# Patient Record
Sex: Male | Born: 1980
Health system: Southern US, Community
[De-identification: ages and names within clinical notes are randomized; demographics above are authoritative.]

---
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
## 2005-01-22 ENCOUNTER — Emergency Department (HOSPITAL_COMMUNITY): Admission: EM | Admit: 2005-01-22 | Discharge: 2005-01-22 | Payer: Self-pay | Admitting: Emergency Medicine

## 2008-06-26 ENCOUNTER — Emergency Department (HOSPITAL_COMMUNITY): Admission: EM | Admit: 2008-06-26 | Discharge: 2008-06-26 | Payer: Self-pay | Admitting: Emergency Medicine

## 2008-08-29 ENCOUNTER — Emergency Department (HOSPITAL_COMMUNITY): Admission: EM | Admit: 2008-08-29 | Discharge: 2008-08-29 | Payer: Self-pay | Admitting: Emergency Medicine

## 2010-04-22 LAB — BASIC METABOLIC PANEL
BUN: 5 mg/dL — ABNORMAL LOW (ref 6–23)
CO2: 28 mEq/L (ref 19–32)
Calcium: 9.5 mg/dL (ref 8.4–10.5)
Chloride: 102 mEq/L (ref 96–112)
Creatinine, Ser: 0.95 mg/dL (ref 0.4–1.5)
GFR calc Af Amer: >60 mL/min (ref >60)
GFR calc non Af Amer: >60 mL/min (ref >60)
Glucose, Bld: 88 mg/dL (ref 70–99)
Potassium: 3.7 mEq/L (ref 3.5–5.1)
Sodium: 139 mEq/L (ref 135–145)

## 2010-04-22 LAB — CBC
HCT: 40 % (ref 39.0–52.0)
Hemoglobin: 13.3 g/dL (ref 13.0–17.0)
MCHC: 33.3 g/dL (ref 30.0–36.0)
MCV: 102.6 fL — ABNORMAL HIGH (ref 78.0–100.0)
Platelets: 174 10*3/uL (ref 150–400)
RBC: 3.9 MIL/uL — ABNORMAL LOW (ref 4.22–5.81)
RDW: 12.8 % (ref 11.5–15.5)
WBC: 8.4 10*3/uL (ref 4.0–10.5)

## 2010-04-22 LAB — DIFFERENTIAL
Basophils Absolute: 0 10*3/uL (ref 0.0–0.1)
Basophils Relative: 0 % (ref 0–1)
Eosinophils Absolute: 0 10*3/uL (ref 0.0–0.7)
Eosinophils Relative: 0 % (ref 0–5)
Lymphocytes Relative: 17 % (ref 12–46)
Lymphs Abs: 1.5 10*3/uL (ref 0.7–4.0)
Monocytes Absolute: 0.6 10*3/uL (ref 0.1–1.0)
Monocytes Relative: 8 % (ref 3–12)
Neutro Abs: 6.3 10*3/uL (ref 1.7–7.7)
Neutrophils Relative %: 75 % (ref 43–77)
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
## 2010-09-16 IMAGING — CT CT MAXILLOFACIAL W/ CM
3 series · 16 of 47 positions shown, 19 images · IV contrast (agent unspecified)
Comparison: None

CLINICAL DATA: Left facial swelling, pain and fever.  Question
abscess.

CT MAXILLOFACIAL WITH CONTRAST
TECHNIQUE: Multidetector CT imaging of the maxillofacial
structures was performed with intravenous contrast. Multiplanar CT
image reconstructions were also generated.
Contrast: 80 ml 2mnipaque-5XX intravenously.

[Series 4: orbit/facial 2.0 h30s st · axial · 0.43mm/px · z∈[+942,+1116]mm · 10 of 101 slices shown, 13 images]
[im 7/101  brain]
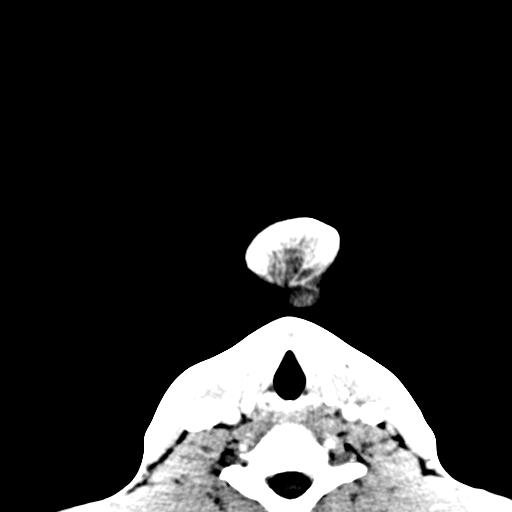
[im 7/101  bone]
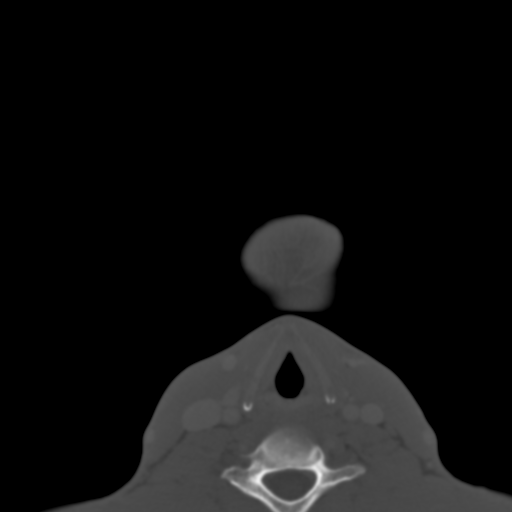
[im 18/101  bone]
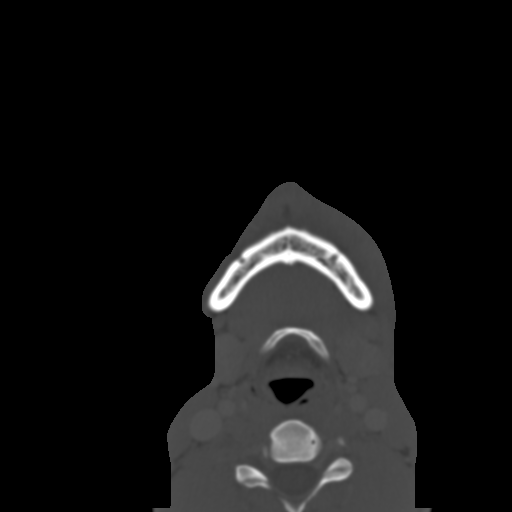
[im 28/101  bone]
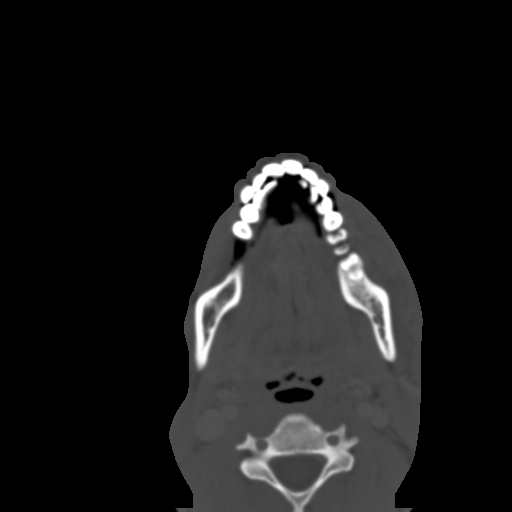
[im 35/101  bone]
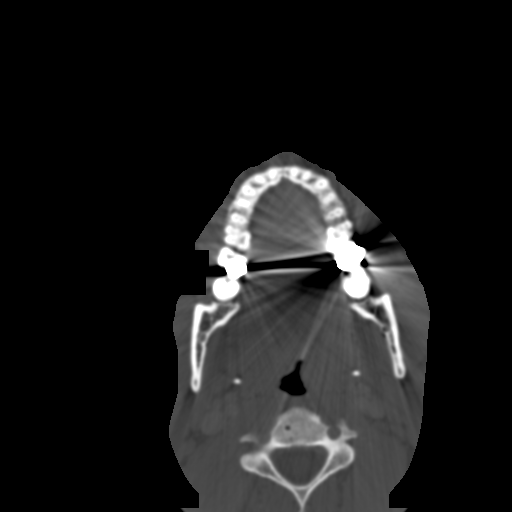
[im 45/101  brain]
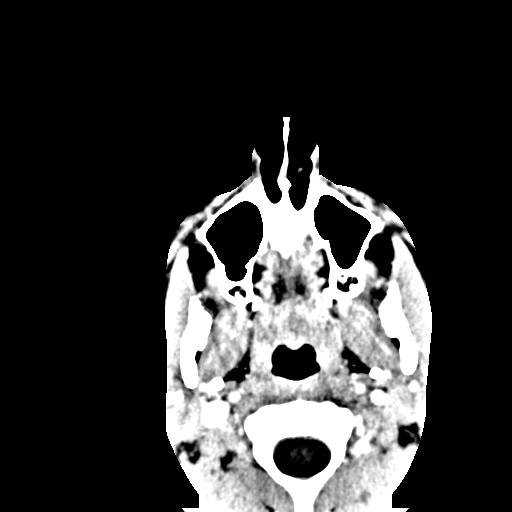
[im 45/101  bone]
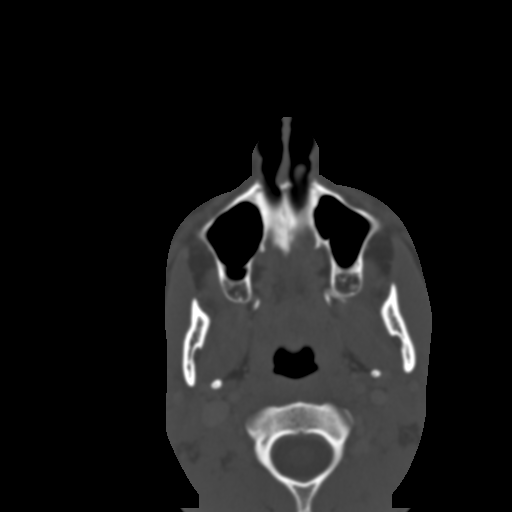
[im 56/101  bone]
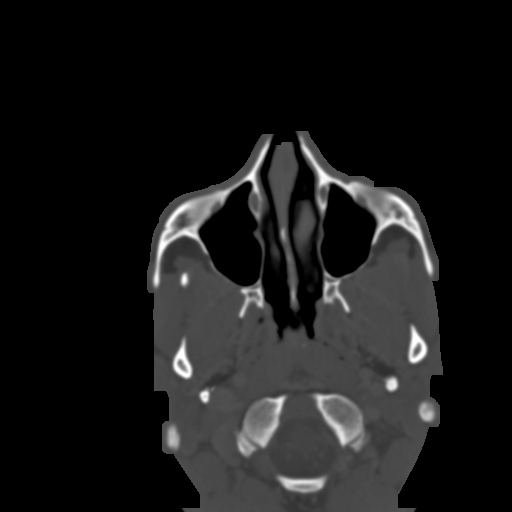
[im 66/101  bone]
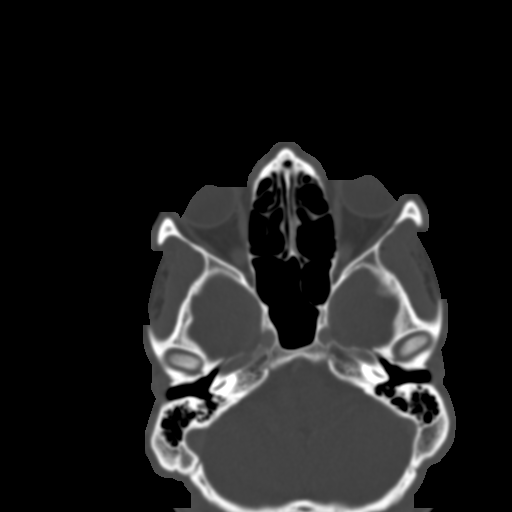
[im 76/101  bone]
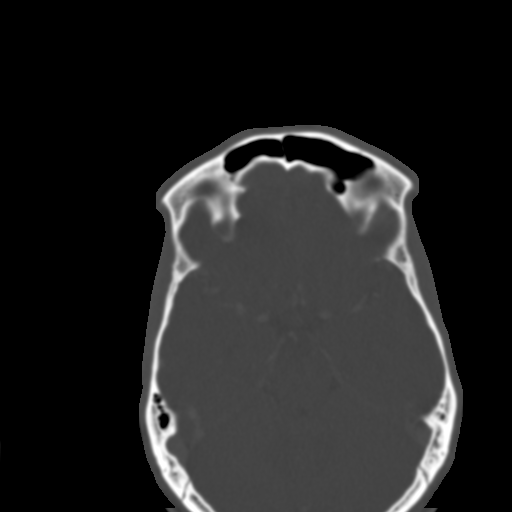
[im 83/101  brain]
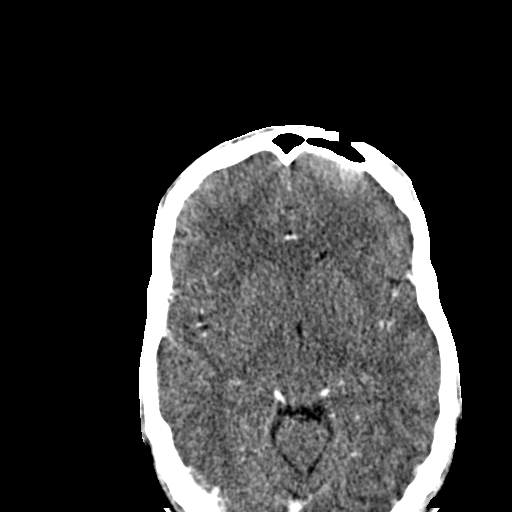
[im 83/101  bone]
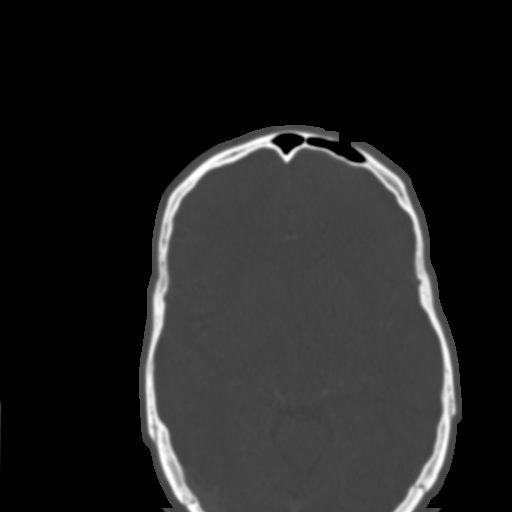
[im 94/101  bone]
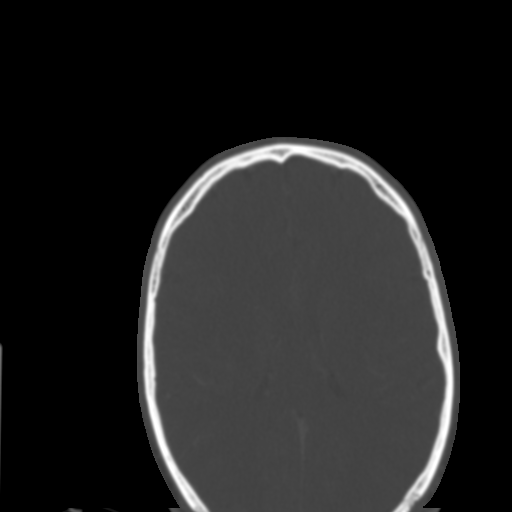

[Series 5: orbit/facial 2.0 spo · coronal · 0.39mm/px · 3 of 80 slices shown (1 of 2)]
[im 27/80  bone]
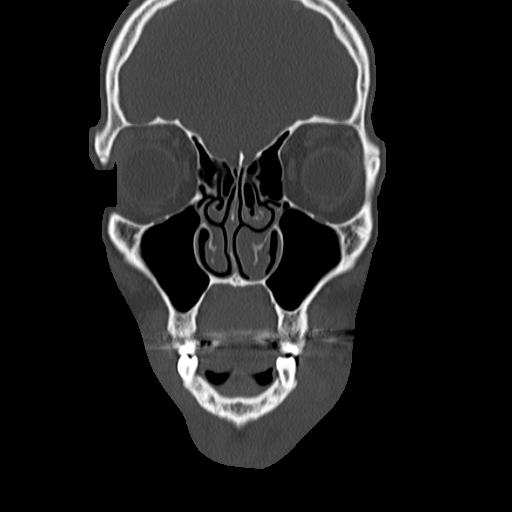
[im 36/80  bone]
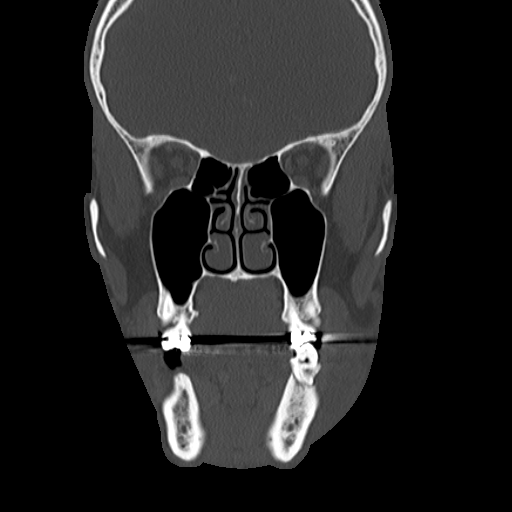
[im 44/80  bone]
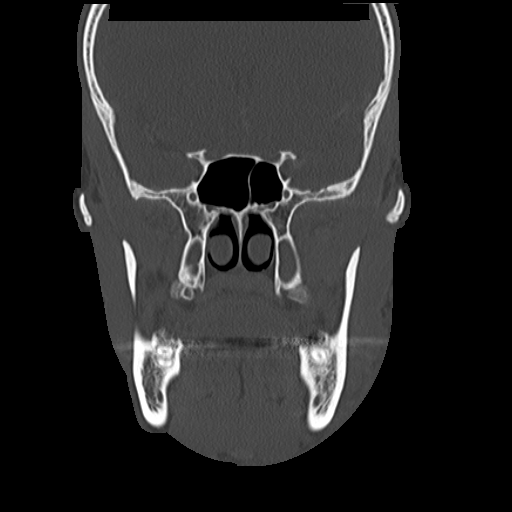

[Series 6: orbit/facial 2.0 spo · sagittal · 0.39mm/px · 3 of 71 slices shown (2 of 2)]
[im 24/71  bone]
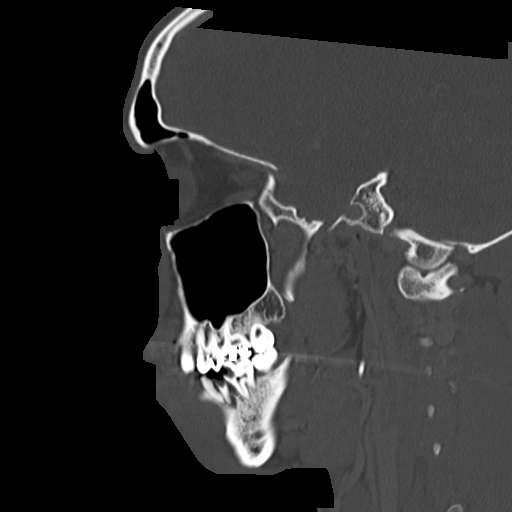
[im 36/71  bone]
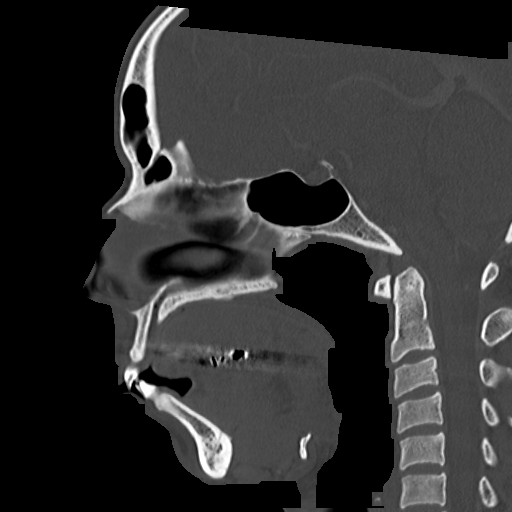
[im 47/71  bone]
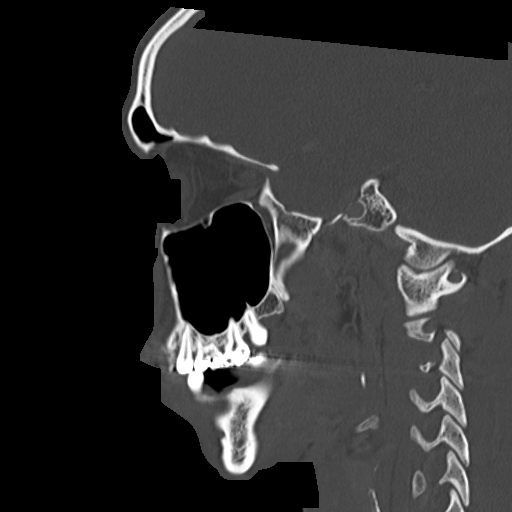

[16 of 47 positions shown; findings below may reference images not displayed]

FINDINGS: There is left perimandibular soft tissue swelling.  Ill-
defined adjacent low density measures up to 1.5 cm on the coronal
images and is suspicious for an early soft tissue abscess.  There
is diffuse periodontal disease with multiple dental caries.
Prominent lucencies are noted adjacent to teeth 18 and 19.  There
is also periapical lucency adjacent to tooth #12.

The temporal mandibular joints are intact.  The mastoids, middle
ears and paranasal sinuses are clear.  No orbital abnormalities are
identified.  The visualized intracranial contents appear
unremarkable.
IMPRESSION: 1.  Left mandibular periodontal disease as described with adjacent
soft tissue swelling and probable early soft tissue abscess
formation. Teeth 18 and 19 appear primarily affected.
2.  Additional periodontal disease with prominent involvement of
tooth number 12.
3.  No evidence of sinusitis or orbital cellulitis.

## 2012-10-05 ENCOUNTER — Emergency Department (HOSPITAL_BASED_OUTPATIENT_CLINIC_OR_DEPARTMENT_OTHER)
Admission: EM | Admit: 2012-10-05 | Discharge: 2012-10-05 | Disposition: A | Discharge status: Home / Self Care | Payer: Self-pay | Attending: Emergency Medicine | Admitting: Emergency Medicine

## 2012-10-05 ENCOUNTER — Encounter (HOSPITAL_BASED_OUTPATIENT_CLINIC_OR_DEPARTMENT_OTHER): Payer: Self-pay | Admitting: Student

## 2012-10-05 DIAGNOSIS — F17200 Nicotine dependence, unspecified, uncomplicated: Secondary | ICD-10-CM | POA: Insufficient documentation

## 2012-10-05 DIAGNOSIS — N342 Other urethritis: Secondary | ICD-10-CM | POA: Insufficient documentation

## 2012-10-05 MED ORDER — AZITHROMYCIN 1 G PO PACK
1.0000 g | PACK | Freq: Once | ORAL | Status: AC
  Administered 2012-10-05: 1 g via ORAL
  Filled 2012-10-05: qty 1

## 2012-10-05 MED ORDER — CEFTRIAXONE SODIUM 250 MG IJ SOLR
250.0000 mg | Freq: Once | INTRAMUSCULAR | Status: AC
  Administered 2012-10-05: 250 mg via INTRAMUSCULAR
  Filled 2012-10-05: qty 250

## 2012-10-05 NOTE — ED Provider Notes (Signed)
CSN: [id number]     Arrival date & time 10/05/12  0045 History   First MD Initiated Contact with Patient 10/05/12 0059     Chief Complaint  Patient presents with  . Penile Discharge   (Consider location/radiation/quality/duration/timing/severity/associated sxs/prior Treatment) HPI This is a 32 year old male with about a three-day history of burning at the end of urination and white discharge from his urethral meatus. He's noticed a discharge in his underwear. Symptoms are like that of prior STDs. He denies systemic symptoms such as fever, chills, nausea, vomiting or abdominal pain.  History reviewed. No pertinent past medical history. History reviewed. No pertinent past surgical history. History reviewed. No pertinent family history. History  Substance Use Topics  . Smoking status: Current Every Day Smoker  . Smokeless tobacco: Not on file  . Alcohol Use: Yes    Review of Systems  All other systems reviewed and are negative.    Allergies  Review of patient's allergies indicates no known allergies.  Home Medications  No current outpatient prescriptions on file. BP 134/73  Pulse 79  Temp(Src) 98.2 F (36.8 C) (Oral)  Resp 20  Wt 170 lb (77.111 kg)  SpO2 100%  Physical Exam General: Well-developed, well-nourished male in no acute distress; appearance consistent with age of record HENT: normocephalic; atraumatic Eyes: pupils equal, round and reactive to light; extraocular muscles intact Neck: supple Heart: regular rate and rhythm; no murmurs, rubs or gallops Lungs: clear to auscultation bilaterally Abdomen: soft; nondistended; nontender; no masses or hepatosplenomegaly; bowel sounds present GU: Tanner 4 male, circumcised; no urethral discharge seen Extremities: No deformity; full range of motion Neurologic: Awake, alert and oriented; motor function intact in all extremities and symmetric; no facial droop Skin: Warm and dry Psychiatric: Normal mood and affect   ED  Course  Procedures (including critical care time)  MDM  We will treat for GC and chlamydia.    Hanley Seamen, MD 10/05/12 [phone]

## 2012-10-05 NOTE — ED Notes (Signed)
MD at bedside. 

## 2012-10-05 NOTE — ED Notes (Signed)
Painful urination, white discharge from penis.

## 2012-10-07 NOTE — ED Notes (Signed)
+   Gonorrhea + Chlamydia  Patient treated with Rocephin And Zithromax-[facility] faxed 

## 2012-10-08 ENCOUNTER — Telehealth (HOSPITAL_COMMUNITY): Payer: Self-pay | Admitting: Emergency Medicine

## 2016-03-20 ENCOUNTER — Encounter (HOSPITAL_BASED_OUTPATIENT_CLINIC_OR_DEPARTMENT_OTHER): Payer: Self-pay | Admitting: Emergency Medicine

## 2016-03-20 ENCOUNTER — Emergency Department (HOSPITAL_BASED_OUTPATIENT_CLINIC_OR_DEPARTMENT_OTHER)
Admission: EM | Admit: 2016-03-20 | Discharge: 2016-03-20 | Disposition: A | Discharge status: Home / Self Care | Payer: Self-pay | Attending: Emergency Medicine | Admitting: Emergency Medicine

## 2016-03-20 DIAGNOSIS — F1290 Cannabis use, unspecified, uncomplicated: Secondary | ICD-10-CM | POA: Insufficient documentation

## 2016-03-20 DIAGNOSIS — N342 Other urethritis: Secondary | ICD-10-CM | POA: Insufficient documentation

## 2016-03-20 DIAGNOSIS — F17200 Nicotine dependence, unspecified, uncomplicated: Secondary | ICD-10-CM | POA: Insufficient documentation

## 2016-03-20 LAB — URINALYSIS, MICROSCOPIC (REFLEX)

## 2016-03-20 LAB — URINALYSIS, ROUTINE W REFLEX MICROSCOPIC
Bilirubin Urine: NEGATIVE
Glucose, UA: NEGATIVE mg/dL
Ketones, ur: NEGATIVE mg/dL
NITRITE: NEGATIVE
Protein, ur: NEGATIVE mg/dL
Specific Gravity, Urine: 1.017 (ref 1.005–1.030)
pH: 7.5 (ref 5.0–8.0)

## 2016-03-20 MED ORDER — CEFTRIAXONE SODIUM 250 MG IJ SOLR
250.0000 mg | Freq: Once | INTRAMUSCULAR | Status: AC
Start: 2016-03-20 — End: 2016-03-20
  Administered 2016-03-20: 250 mg via INTRAMUSCULAR
  Filled 2016-03-20: qty 250

## 2016-03-20 MED ORDER — AZITHROMYCIN 1 G PO PACK
1.0000 g | PACK | Freq: Once | ORAL | Status: AC
  Administered 2016-03-20: 1 g via ORAL
  Filled 2016-03-20: qty 1
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
## 2016-03-20 NOTE — ED Triage Notes (Signed)
Patient states that he has some blood in his urine today after work. The patient denies any pain while sitting in triage. No distress noted.

## 2016-03-20 NOTE — ED Notes (Signed)
Pt verbalizes understanding of d/c instructions and denies any further needs at this time. 

## 2016-03-24 LAB — GC/CHLAMYDIA PROBE AMP (~~LOC~~) NOT AT ARMC
Chlamydia: POSITIVE — AB
Neisseria Gonorrhea: NEGATIVE

## 2016-04-24 ENCOUNTER — Encounter (HOSPITAL_BASED_OUTPATIENT_CLINIC_OR_DEPARTMENT_OTHER): Payer: Self-pay | Admitting: *Deleted

## 2016-04-24 DIAGNOSIS — S81012A Laceration without foreign body, left knee, initial encounter: Secondary | ICD-10-CM | POA: Insufficient documentation

## 2016-04-24 DIAGNOSIS — F17200 Nicotine dependence, unspecified, uncomplicated: Secondary | ICD-10-CM | POA: Insufficient documentation

## 2016-04-24 DIAGNOSIS — Y999 Unspecified external cause status: Secondary | ICD-10-CM | POA: Insufficient documentation

## 2016-04-24 DIAGNOSIS — Y929 Unspecified place or not applicable: Secondary | ICD-10-CM | POA: Insufficient documentation

## 2016-04-24 DIAGNOSIS — W268XXA Contact with other sharp object(s), not elsewhere classified, initial encounter: Secondary | ICD-10-CM | POA: Insufficient documentation

## 2016-04-24 DIAGNOSIS — Z23 Encounter for immunization: Secondary | ICD-10-CM | POA: Insufficient documentation

## 2016-04-24 DIAGNOSIS — Y9389 Activity, other specified: Secondary | ICD-10-CM | POA: Insufficient documentation

## 2016-04-24 NOTE — ED Triage Notes (Signed)
Pt c/o left knee lac by metal x 8 hrs ago

## 2016-04-25 ENCOUNTER — Emergency Department (HOSPITAL_BASED_OUTPATIENT_CLINIC_OR_DEPARTMENT_OTHER)
Admission: EM | Admit: 2016-04-25 | Discharge: 2016-04-25 | Disposition: A | Discharge status: Home / Self Care | Payer: Self-pay | Attending: Emergency Medicine | Admitting: Emergency Medicine

## 2016-04-25 DIAGNOSIS — S81012A Laceration without foreign body, left knee, initial encounter: Secondary | ICD-10-CM

## 2016-04-25 MED ORDER — CEPHALEXIN 500 MG PO CAPS: 500.0000 mg | ORAL_CAPSULE | Freq: Four times a day (QID) | ORAL | 0 refills | Status: DC

## 2016-04-25 MED ORDER — LIDOCAINE-EPINEPHRINE 2 %-1:100000 IJ SOLN
20.0000 mL | Freq: Once | INTRAMUSCULAR | Status: AC
  Administered 2016-04-25: 5 mL via INTRADERMAL
  Filled 2016-04-25: qty 1

## 2016-04-25 MED ORDER — TETANUS-DIPHTH-ACELL PERTUSSIS 5-2.5-18.5 LF-MCG/0.5 IM SUSP
0.5000 mL | Freq: Once | INTRAMUSCULAR | Status: AC
  Administered 2016-04-25: 0.5 mL via INTRAMUSCULAR
  Filled 2016-04-25: qty 0.5

## 2016-04-25 MED ORDER — CEPHALEXIN 250 MG PO CAPS
1000.0000 mg | ORAL_CAPSULE | Freq: Once | ORAL | Status: AC
  Administered 2016-04-25: 1000 mg via ORAL
  Filled 2016-04-25: qty 4

## 2016-04-25 NOTE — ED Provider Notes (Signed)
[facility] Provider Note: Seth Dell, MD, FACEP  CSN: [id number] MRN: [id number] ARRIVAL: 04/24/16 at 2307 ROOM: MH06/MH06   CHIEF COMPLAINT  Laceration   HISTORY OF PRESENT ILLNESS  Seth Martin is a 36 y.o. male who hit his left knee while assembling a bed frame at work yesterday afternoon about 3 PM. He has a laceration overlying the inferior aspect of his patella. Bleeding has been controlled with pressure. He states it is no longer painful. He is not sure of his last tetanus shot. There is no functional deficit or numbness associated with the wound.   History reviewed. No pertinent past medical history.  History reviewed. No pertinent surgical history.  History reviewed. No pertinent family history.  Social History  Substance Use Topics  . Smoking status: Current Every Day Smoker    Packs/day: 1.00  . Smokeless tobacco: Never Used  . Alcohol use Yes    Prior to Admission medications   Not on File    Allergies Shellfish allergy   REVIEW OF SYSTEMS  Negative except as noted here or in the History of Present Illness.   PHYSICAL EXAMINATION  Initial Vital Signs Blood pressure 113/64, pulse (!) 58, temperature 98.6 F (37 C), temperature source Oral, resp. rate 16, height  (1.854 m), weight 160 lb (72.6 kg), SpO2 99 %.  Examination General: Well-developed, well-nourished male in no acute distress; appearance consistent with age of record HENT: normocephalic; atraumatic Eyes: Normal appearance Neck: supple Heart: regular rate and rhythm Lungs: clear to auscultation bilaterally Abdomen: soft; nondistended Extremities: No deformity; full range of motion; pulses normal Neurologic: Awake, alert and oriented; motor function intact in all extremities and symmetric; no facial droop Skin: Warm and dry Psychiatric: Normal mood and affect   RESULTS  Summary of this visit's results, reviewed by myself:   EKG Interpretation  Date/Time:      Ventricular Rate:    PR Interval:    QRS Duration:   QT Interval:    QTC Calculation:   R Axis:     Text Interpretation:        Laboratory Studies: No results found for this or any previous visit (from the past 24 hour(s)). Imaging Studies: No results found.  ED COURSE  Nursing notes and initial vitals signs, including pulse oximetry, reviewed.  Vitals:   04/24/16 2310 04/24/16 2314 04/25/16 0054  BP:  (!) 109/98 113/64  Pulse:  82 (!) 58  Resp:  16   Temp:  98.1 F (36.7 C) 98.6 F (37 C)  TempSrc:  Oral Oral  SpO2:  100% 99%  Weight: 160 lb (72.6 kg)    Height:  (1.854 m)      PROCEDURES   LACERATION REPAIR Performed by: Hanley Seamen Authorized by: Hanley Seamen Consent: Verbal consent obtained. Risks and benefits: risks, benefits and alternatives were discussed Consent given by: patient Patient identity confirmed: provided demographic data Prepped and Draped in normal sterile fashion Wound explored  Laceration Location: Left knee  Laceration Length: 4 cm  No Foreign Bodies seen or palpated  Anesthesia: local infiltration  Local anesthetic: lidocaine 2 % with epinephrine  Anesthetic total: 2.5 ml  Irrigation method: syringe Amount of cleaning: standard  Skin closure: Staples   Number of staples: 4   Patient tolerance: Patient tolerated the procedure well with no immediate complications.   ED DIAGNOSES     ICD-9-CM ICD-10-CM   1. Laceration of skin of left knee, initial encounter 891.0 S81.012A  Seth Libra, MD 04/25/16 [phone]

## 2016-05-04 ENCOUNTER — Encounter (HOSPITAL_BASED_OUTPATIENT_CLINIC_OR_DEPARTMENT_OTHER): Payer: Self-pay | Admitting: *Deleted

## 2016-05-04 ENCOUNTER — Emergency Department (HOSPITAL_BASED_OUTPATIENT_CLINIC_OR_DEPARTMENT_OTHER)
Admission: EM | Admit: 2016-05-04 | Discharge: 2016-05-04 | Disposition: A | Discharge status: Home / Self Care | Payer: Self-pay | Attending: Emergency Medicine | Admitting: Emergency Medicine

## 2016-05-04 DIAGNOSIS — F17200 Nicotine dependence, unspecified, uncomplicated: Secondary | ICD-10-CM | POA: Insufficient documentation

## 2016-05-04 DIAGNOSIS — Z4802 Encounter for removal of sutures: Secondary | ICD-10-CM | POA: Insufficient documentation

## 2016-05-04 NOTE — ED Triage Notes (Signed)
EDP into room, prior to RN assessment, see MD notes, orders received to d/c initiated. Triage and care assumed at time of d/c.   Returns on day 10 for L knee staple removal #4, denies complications, completed keflex, denies pain, wound approximated and healing well w/o redness, swelling, drainage  Alert, NAD, calm, interactive, resps e/u, speaking in clear complete sentences, no dyspnea noted, skin W&D, VSS, (denies: pain, sob, nausea, dizziness or visual changes), EDP into room. Family at [facility].

## 2016-05-04 NOTE — ED Notes (Signed)
Pt seen by EDP after registration, prior to triage.

## 2016-05-04 NOTE — ED Provider Notes (Signed)
   [facility] Provider Note: [name], MD, FACEP  CSN: [id number] MRN: [id number] ARRIVAL: 05/04/16 at 0630 ROOM: MHTR1/MHTR1   CHIEF COMPLAINT  Staple Removal   HISTORY OF PRESENT ILLNESS  Seth Martin is a 36 y.o. male who I laceration to his left knee closed with staples by myself on the 13th of this month. He returns for staple removal. He is having no pain, swelling, erythema or drainage at the wound site. He presents a day early because he has to leave town tomorrow.   History reviewed. No pertinent past medical history.  History reviewed. No pertinent surgical history.  History reviewed. No pertinent family history.  Social History  Substance Use Topics  . Smoking status: Current Every Day Smoker    Packs/day: 1.00  . Smokeless tobacco: Never Used  . Alcohol use Yes    Prior to Admission medications   Not on File    Allergies Shellfish allergy   REVIEW OF SYSTEMS  Negative except as noted here or in the History of Present Illness.   PHYSICAL EXAMINATION  Initial Vital Signs Blood pressure 120/77, pulse 66, temperature 98.8 F (37.1 C), temperature source Oral, resp. rate 16, height  (1.854 m), weight 160 lb (72.6 kg), SpO2 100 %.   Examination General: Well-developed, well-nourished male in no acute distress; appearance consistent with age of record HENT: normocephalic; atraumatic Eyes: Normal appearance Neck: supple Heart: regular rate and rhythm Lungs: clear to auscultation bilaterally Abdomen: soft; nondistended Extremities: No deformity; full range of motion Neurologic: Awake, alert and oriented; motor function intact in all extremities and symmetric; no facial droop Skin: Warm and dry; well healing stapled wound of left knee without signs of infection Psychiatric: Normal mood and affect   RESULTS  Summary of this visit's results, reviewed by myself:   EKG Interpretation  Date/Time:    Ventricular Rate:    PR  Interval:    QRS Duration:   QT Interval:    QTC Calculation:   R Axis:     Text Interpretation:        Laboratory Studies: No results found for this or any previous visit (from the past 24 hour(s)). Imaging Studies: No results found.  ED COURSE  Nursing notes and initial vitals signs, including pulse oximetry, reviewed.  Vitals:   05/04/16 0648 05/04/16 0651  BP: 120/77   Pulse: 66   Resp: 16   Temp: 98.8 F (37.1 C)   TempSrc: Oral   SpO2: 100%   Weight:  160 lb (72.6 kg)  Height:   (1.854 m)    PROCEDURES   STAPLE REMOVAL Four surgical staples were removed using the standard surgical staple remover from the patient's left knee. He tolerated this well and there were no immediate complications.  ED DIAGNOSES     ICD-9-CM ICD-10-CM   1. Removal of staples V58.32 Z48.02        [name], MD 05/04/16 [phone]

## 2016-05-16 DIAGNOSIS — F17200 Nicotine dependence, unspecified, uncomplicated: Secondary | ICD-10-CM | POA: Insufficient documentation

## 2016-05-16 DIAGNOSIS — N341 Nonspecific urethritis: Secondary | ICD-10-CM | POA: Insufficient documentation

## 2016-05-17 ENCOUNTER — Encounter (HOSPITAL_BASED_OUTPATIENT_CLINIC_OR_DEPARTMENT_OTHER): Payer: Self-pay

## 2016-05-17 ENCOUNTER — Emergency Department (HOSPITAL_BASED_OUTPATIENT_CLINIC_OR_DEPARTMENT_OTHER)
Admission: EM | Admit: 2016-05-17 | Discharge: 2016-05-17 | Disposition: A | Discharge status: Home / Self Care | Payer: Self-pay | Attending: Emergency Medicine | Admitting: Emergency Medicine

## 2016-05-17 DIAGNOSIS — N342 Other urethritis: Secondary | ICD-10-CM

## 2016-05-17 LAB — URINALYSIS, ROUTINE W REFLEX MICROSCOPIC
Bilirubin Urine: NEGATIVE
Glucose, UA: NEGATIVE mg/dL
Hgb urine dipstick: NEGATIVE
Ketones, ur: NEGATIVE mg/dL
Nitrite: NEGATIVE
PROTEIN: NEGATIVE mg/dL
Specific Gravity, Urine: 1.025 (ref 1.005–1.030)
pH: 7 (ref 5.0–8.0)

## 2016-05-17 LAB — URINALYSIS, MICROSCOPIC (REFLEX)

## 2016-05-17 MED ORDER — CEFTRIAXONE SODIUM 250 MG IJ SOLR
250.0000 mg | Freq: Once | INTRAMUSCULAR | Status: AC
  Administered 2016-05-17: 250 mg via INTRAMUSCULAR
  Filled 2016-05-17: qty 250

## 2016-05-17 MED ORDER — AZITHROMYCIN 250 MG PO TABS
1000.0000 mg | ORAL_TABLET | Freq: Every day | ORAL | Status: DC
  Administered 2016-05-17: 1000 mg via ORAL
  Filled 2016-05-17: qty 4

## 2016-05-17 NOTE — ED Provider Notes (Signed)
[facility] Provider Note: Seth Dell, MD, FACEP  CSN: [id number] MRN: [id number] ARRIVAL: 05/16/16 at 2353 ROOM: MH10/MH10   CHIEF COMPLAINT  Penile Discharge   HISTORY OF PRESENT ILLNESS  Seth Martin is a 36 y.o. male with a two-day history of penile discharge. He describes the discharge as pale yellow to white. It is of mild to moderate quantity. He denies burning with urination or abdominal pain. Symptoms are similar to urethritis he has had in the past.   History reviewed. No pertinent past medical history.  History reviewed. No pertinent surgical history.  No family history on file.  Social History  Substance Use Topics  . Smoking status: Current Every Day Smoker    Packs/day: 1.00  . Smokeless tobacco: Never Used  . Alcohol use Yes    Prior to Admission medications   Not on File    Allergies Shellfish allergy   REVIEW OF SYSTEMS  Negative except as noted here or in the History of Present Illness.   PHYSICAL EXAMINATION  Initial Vital Signs Blood pressure 129/75, pulse 75, temperature 97.8 F (36.6 C), temperature source Oral, resp. rate 18, height 6\' 1"  (1.854 m), weight 160 lb (72.6 kg), SpO2 100 %.  Examination General: Well-developed, well-nourished male in no acute distress; appearance consistent with age of record HENT: normocephalic; atraumatic Eyes: pupils equal, round and reactive to light; extraocular muscles intact Neck: supple Heart: regular rate and rhythm Lungs: clear to auscultation bilaterally Abdomen: soft; nondistended; nontender; no masses or hepatosplenomegaly; bowel sounds present GU: Tanner 5 male, circumcised; white urethral discharge Extremities: No deformity; full range of motion Neurologic: Awake, alert and oriented; motor function intact in all extremities and symmetric; no facial droop Skin: Warm and dry Psychiatric: Normal mood and affect   RESULTS  Summary of this visit's results, reviewed by  myself:   EKG Interpretation  Date/Time:    Ventricular Rate:    PR Interval:    QRS Duration:   QT Interval:    QTC Calculation:   R Axis:     Text Interpretation:        Laboratory Studies: Results for orders placed or performed during the hospital encounter of 05/17/16 (from the past 24 hour(s))  Urinalysis, Routine w reflex microscopic     Status: Abnormal   Collection Time: 05/17/16 12:20 AM  Result Value Ref Range   Color, Urine YELLOW YELLOW   APPearance CLEAR CLEAR   Specific Gravity, Urine 1.025 1.005 - 1.030   pH 7.0 5.0 - 8.0   Glucose, UA NEGATIVE NEGATIVE mg/dL   Hgb urine dipstick NEGATIVE NEGATIVE   Bilirubin Urine NEGATIVE NEGATIVE   Ketones, ur NEGATIVE NEGATIVE mg/dL   Protein, ur NEGATIVE NEGATIVE mg/dL   Nitrite NEGATIVE NEGATIVE   Leukocytes, UA MODERATE (A) NEGATIVE  Urinalysis, Microscopic (reflex)     Status: Abnormal   Collection Time: 05/17/16 12:20 AM  Result Value Ref Range   RBC / HPF 0-5 0 - 5 RBC/hpf   WBC, UA TOO NUMEROUS TO COUNT 0 - 5 WBC/hpf   Bacteria, UA RARE (A) NONE SEEN   Squamous Epithelial / LPF 0-5 (A) NONE SEEN   Imaging Studies: No results found.  ED COURSE  Nursing notes and initial vitals signs, including pulse oximetry, reviewed.  Vitals:   05/17/16 0019 05/17/16 0020  BP: 129/75   Pulse: 75   Resp: 18   Temp: 97.8 F (36.6 C)   TempSrc: Oral   SpO2: 100%   Weight:  160 lb (72.6 kg)  Height:  6\' 1"  (1.854 m)    PROCEDURES    ED DIAGNOSES     ICD-9-CM ICD-10-CM   1. Urethritis 597.80 N34.2        Aime Carreras, Jonny RuizJohn, MD 05/17/16 [phone]

## 2016-05-17 NOTE — ED Triage Notes (Signed)
Pt c/o penile discharge for the last three days, here with a friend and does not want to share information with her

## 2016-05-18 LAB — HIV ANTIBODY (ROUTINE TESTING W REFLEX): HIV SCREEN 4TH GENERATION: NONREACTIVE

## 2016-05-19 LAB — GC/CHLAMYDIA PROBE AMP (~~LOC~~) NOT AT ARMC
Chlamydia: POSITIVE — AB
NEISSERIA GONORRHEA: NEGATIVE

## 2016-12-30 ENCOUNTER — Emergency Department (HOSPITAL_BASED_OUTPATIENT_CLINIC_OR_DEPARTMENT_OTHER)
Admission: EM | Admit: 2016-12-30 | Discharge: 2016-12-30 | Disposition: A | Discharge status: Home / Self Care | Payer: Self-pay | Attending: Emergency Medicine | Admitting: Emergency Medicine

## 2016-12-30 ENCOUNTER — Encounter (HOSPITAL_BASED_OUTPATIENT_CLINIC_OR_DEPARTMENT_OTHER): Payer: Self-pay | Admitting: Emergency Medicine

## 2016-12-30 ENCOUNTER — Other Ambulatory Visit: Payer: Self-pay

## 2016-12-30 DIAGNOSIS — X500XXA Overexertion from strenuous movement or load, initial encounter: Secondary | ICD-10-CM | POA: Insufficient documentation

## 2016-12-30 DIAGNOSIS — Y9329 Activity, other involving ice and snow: Secondary | ICD-10-CM | POA: Insufficient documentation

## 2016-12-30 DIAGNOSIS — Y929 Unspecified place or not applicable: Secondary | ICD-10-CM | POA: Insufficient documentation

## 2016-12-30 DIAGNOSIS — S39012A Strain of muscle, fascia and tendon of lower back, initial encounter: Secondary | ICD-10-CM | POA: Insufficient documentation

## 2016-12-30 DIAGNOSIS — F17200 Nicotine dependence, unspecified, uncomplicated: Secondary | ICD-10-CM | POA: Insufficient documentation

## 2016-12-30 DIAGNOSIS — Y999 Unspecified external cause status: Secondary | ICD-10-CM | POA: Insufficient documentation

## 2016-12-30 LAB — URINALYSIS, ROUTINE W REFLEX MICROSCOPIC
BILIRUBIN URINE: NEGATIVE
Glucose, UA: NEGATIVE mg/dL
Ketones, ur: NEGATIVE mg/dL
LEUKOCYTES UA: NEGATIVE
NITRITE: NEGATIVE
PROTEIN: NEGATIVE mg/dL
Specific Gravity, Urine: 1.02 (ref 1.005–1.030)
pH: 6 (ref 5.0–8.0)

## 2016-12-30 LAB — URINALYSIS, MICROSCOPIC (REFLEX)

## 2016-12-30 MED ORDER — IBUPROFEN 800 MG PO TABS
800.0000 mg | ORAL_TABLET | Freq: Once | ORAL | Status: AC
  Administered 2016-12-30: 800 mg via ORAL
  Filled 2016-12-30: qty 1

## 2016-12-30 MED ORDER — CYCLOBENZAPRINE HCL 10 MG PO TABS: 10.0000 mg | ORAL_TABLET | Freq: Two times a day (BID) | ORAL | 0 refills | Status: DC | PRN

## 2016-12-30 NOTE — ED Triage Notes (Signed)
Reports R lower back pain since shoveling snow 1 week ago. Pt states "it feels like its my kidney". Denies difficulty or pain with urination.

## 2016-12-30 NOTE — Discharge Instructions (Signed)
You may alternate Tylenol 1000 mg every 6 hours as needed for pain and Ibuprofen 800 mg every 8 hours as needed for pain.  Please take Ibuprofen with food. ° ° ° °To find a primary care or specialty doctor please call [phone] or [phone] to access "[facility] Find a Doctor Service." ° °You may also go on the [facility] website at [email or web address] ° °There are also multiple [facility], [facility], [facility] and [facility] practices throughout the [place] that are frequently accepting new patients. You may find a clinic that is close to your home and contact them. ° °[facility] -  °[address] °[place] [address] [phone] ° ° °[facility] -  °[address] °[place] [address] [phone] ° ° °[facility] - °[address]  °[place] [address] °[phone] ° ° °

## 2016-12-30 NOTE — ED Provider Notes (Signed)
TIME SEEN: 3:43 AM  CHIEF COMPLAINT: Back pain  HPI: Patient is a 36 year old male with history of urethritis who presents emergency department with right lower back pain.  Started a week ago after shoveling snow.  Worse with twisting, bending, movement.  No radiation of pain.  Describes a sharp and throbbing.  No other injury that he can recall.  No numbness, tingling or focal weakness.  No bowel or bladder incontinence.  He states that he thinks it is "my kidney".  No history of kidney stones or kidney infection.  No CVA tenderness.  Does report intermittent urinary frequency but no dysuria, hematuria, penile discharge.  He states that he does not think he has an STD.  He declines STD screening today.  States he has been using condoms regularly.  ROS: See HPI Constitutional: no fever  Eyes: no drainage  ENT: no runny nose   Cardiovascular:  no chest pain  Resp: no SOB  GI: no vomiting GU: no dysuria Integumentary: no rash  Allergy: no hives  Musculoskeletal: no leg swelling  Neurological: no slurred speech ROS otherwise negative  PAST MEDICAL HISTORY/PAST SURGICAL HISTORY:  History reviewed. No pertinent past medical history.  MEDICATIONS:  Prior to Admission medications   Not on File    ALLERGIES:  Allergies  Allergen Reactions  . Shellfish Allergy Anaphylaxis    SOCIAL HISTORY:  Social History   Tobacco Use  . Smoking status: Current Every Day Smoker    Packs/day: 1.00  . Smokeless tobacco: Never Used  Substance Use Topics  . Alcohol use: Yes    Comment: occasional    FAMILY HISTORY: No family history on file.  EXAM: BP 121/77 (BP Location: Right Arm)   Pulse 94   Temp 98 F (36.7 C) (Oral)   Resp 16   Ht 6\' 1"  (1.854 m)   Wt 70.9 kg (156 lb 6.4 oz)   SpO2 99%   BMI 20.63 kg/m  CONSTITUTIONAL: Alert and oriented and responds appropriately to questions. Well-appearing; well-nourished HEAD: Normocephalic EYES: Conjunctivae clear, pupils appear equal,  EOMI ENT: normal nose; moist mucous membranes NECK: Supple, no meningismus, no nuchal rigidity, no LAD  CARD: RRR; S1 and S2 appreciated; no murmurs, no clicks, no rubs, no gallops RESP: Normal chest excursion without splinting or tachypnea; breath sounds clear and equal bilaterally; no wheezes, no rhonchi, no rales, no hypoxia or respiratory distress, speaking full sentences ABD/GI: Normal bowel sounds; non-distended; soft, non-tender, no rebound, no guarding, no peritoneal signs, no hepatosplenomegaly BACK:  The back appears normal and is tender over the right lumbar paraspinal muscles, no midline spinal tenderness or step-off or deformity, no swelling or ecchymosis or erythema or warmth, there is no CVA tenderness EXT: Normal ROM in all joints; non-tender to palpation; no edema; normal capillary refill; no cyanosis, no calf tenderness or swelling    SKIN: Normal color for age and race; warm; no rash NEURO: Moves all extremities equally, strength 5/5 in all 4 extremities, 2+ deep tendon reflexes in bilateral upper and lower extremities, no clonus, normal gait, no saddle anesthesia, normal sensation diffusely PSYCH: The patient's mood and manner are appropriate. Grooming and personal hygiene are appropriate.  MEDICAL DECISION MAKING: Patient here with what I suspect is lumbar strain.  He is requesting that we checked his urine today as he is concerned this is "my kidney".  I have low suspicion for pyelonephritis or kidney stone.  Seems to be musculoskeletal in nature.  Nothing at this time to suggest  fracture, cauda equina, spinal stenosis, epidural abscess or hematoma, discitis, transverse myelitis.  I do not feel he needs emergent MRI.  He did drive himself to the emergency department.  Will give ibuprofen.  ED PROGRESS: Urine shows no sign of infection or blood.  I feel he is safe to be discharged.  Will discharge with ibuprofen and Flexeril.   At this time, I do not feel there is any  life-threatening condition present. I have reviewed and discussed all results (EKG, imaging, lab, urine as appropriate) and exam findings with patient/family. I have reviewed nursing notes and appropriate previous records.  I feel the patient is safe to be discharged home without further emergent workup and can continue workup as an outpatient as needed. Discussed usual and customary return precautions. Patient/family verbalize understanding and are comfortable with this plan.  Outpatient follow-up has been provided if needed. All questions have been answered.      Ashkan Chamberland, Layla MawKristen N, DO 12/30/16 [phone]

## 2017-05-05 ENCOUNTER — Emergency Department (HOSPITAL_BASED_OUTPATIENT_CLINIC_OR_DEPARTMENT_OTHER): Payer: Self-pay

## 2017-05-05 ENCOUNTER — Emergency Department (HOSPITAL_BASED_OUTPATIENT_CLINIC_OR_DEPARTMENT_OTHER)
Admission: EM | Admit: 2017-05-05 | Discharge: 2017-05-05 | Disposition: A | Discharge status: Home / Self Care | Payer: Self-pay | Attending: Emergency Medicine | Admitting: Emergency Medicine

## 2017-05-05 ENCOUNTER — Encounter (HOSPITAL_BASED_OUTPATIENT_CLINIC_OR_DEPARTMENT_OTHER): Payer: Self-pay

## 2017-05-05 DIAGNOSIS — K824 Cholesterolosis of gallbladder: Secondary | ICD-10-CM | POA: Insufficient documentation

## 2017-05-05 DIAGNOSIS — Y939 Activity, unspecified: Secondary | ICD-10-CM | POA: Insufficient documentation

## 2017-05-05 DIAGNOSIS — Y999 Unspecified external cause status: Secondary | ICD-10-CM | POA: Insufficient documentation

## 2017-05-05 DIAGNOSIS — Y929 Unspecified place or not applicable: Secondary | ICD-10-CM | POA: Insufficient documentation

## 2017-05-05 DIAGNOSIS — T148XXA Other injury of unspecified body region, initial encounter: Secondary | ICD-10-CM

## 2017-05-05 DIAGNOSIS — S39011A Strain of muscle, fascia and tendon of abdomen, initial encounter: Secondary | ICD-10-CM | POA: Insufficient documentation

## 2017-05-05 DIAGNOSIS — X58XXXA Exposure to other specified factors, initial encounter: Secondary | ICD-10-CM | POA: Insufficient documentation

## 2017-05-05 DIAGNOSIS — R10811 Right upper quadrant abdominal tenderness: Secondary | ICD-10-CM

## 2017-05-05 LAB — CBC WITH DIFFERENTIAL/PLATELET
Basophils Absolute: 0 10*3/uL (ref 0.0–0.1)
Basophils Relative: 0 %
Eosinophils Absolute: 0.1 10*3/uL (ref 0.0–0.7)
Eosinophils Relative: 1 %
HEMATOCRIT: 35.2 % — AB (ref 39.0–52.0)
Hemoglobin: 11.9 g/dL — ABNORMAL LOW (ref 13.0–17.0)
LYMPHS PCT: 32 %
Lymphs Abs: 1.7 10*3/uL (ref 0.7–4.0)
MCH: 34.6 pg — ABNORMAL HIGH (ref 26.0–34.0)
MCHC: 33.8 g/dL (ref 30.0–36.0)
MCV: 102.3 fL — ABNORMAL HIGH (ref 78.0–100.0)
Monocytes Absolute: 0.4 10*3/uL (ref 0.1–1.0)
Monocytes Relative: 7 %
NEUTROS ABS: 3.1 10*3/uL (ref 1.7–7.7)
Neutrophils Relative %: 60 %
Platelets: 176 10*3/uL (ref 150–400)
RBC: 3.44 MIL/uL — AB (ref 4.22–5.81)
RDW: 11.8 % (ref 11.5–15.5)
WBC: 5.3 10*3/uL (ref 4.0–10.5)

## 2017-05-05 LAB — URINALYSIS, ROUTINE W REFLEX MICROSCOPIC
Bilirubin Urine: NEGATIVE
GLUCOSE, UA: NEGATIVE mg/dL
Ketones, ur: NEGATIVE mg/dL
Leukocytes, UA: NEGATIVE
Nitrite: NEGATIVE
PROTEIN: NEGATIVE mg/dL
Specific Gravity, Urine: <1.005 — ABNORMAL LOW (ref 1.005–1.030)
pH: 6.5 (ref 5.0–8.0)

## 2017-05-05 LAB — COMPREHENSIVE METABOLIC PANEL
ALT: 13 U/L — AB (ref 17–63)
AST: 23 U/L (ref 15–41)
Albumin: 4 g/dL (ref 3.5–5.0)
Alkaline Phosphatase: 41 U/L (ref 38–126)
Anion gap: 7 (ref 5–15)
BILIRUBIN TOTAL: 0.3 mg/dL (ref 0.3–1.2)
BUN: 10 mg/dL (ref 6–20)
CHLORIDE: 107 mmol/L (ref 101–111)
CO2: 24 mmol/L (ref 22–32)
CREATININE: 0.86 mg/dL (ref 0.61–1.24)
Calcium: 8.8 mg/dL — ABNORMAL LOW (ref 8.9–10.3)
GFR calc Af Amer: >60 mL/min (ref >60)
Glucose, Bld: 111 mg/dL — ABNORMAL HIGH (ref 65–99)
Potassium: 4 mmol/L (ref 3.5–5.1)
Sodium: 138 mmol/L (ref 135–145)
Total Protein: 6.8 g/dL (ref 6.5–8.1)

## 2017-05-05 LAB — LIPASE, BLOOD: LIPASE: 29 U/L (ref 11–51)

## 2017-05-05 LAB — URINALYSIS, MICROSCOPIC (REFLEX)

## 2017-05-05 MED ORDER — ACETAMINOPHEN 325 MG PO TABS
650.0000 mg | ORAL_TABLET | Freq: Four times a day (QID) | ORAL | 0 refills | Status: AC | PRN
Start: 2017-05-05 — End: ?

## 2017-05-05 MED ORDER — CYCLOBENZAPRINE HCL 5 MG PO TABS: 5.0000 mg | ORAL_TABLET | Freq: Every evening | ORAL | 0 refills | Status: AC | PRN

## 2017-05-05 MED ORDER — IBUPROFEN 200 MG PO TABS: 400.0000 mg | ORAL_TABLET | Freq: Four times a day (QID) | ORAL | 0 refills | Status: AC | PRN

## 2017-05-05 MED FILL — IBUPROFEN 200 MG TABLET: 200 | 12 days supply | Qty: 100 | Fill #0

## 2017-05-05 MED FILL — CYCLOBENZAPRINE 5 MG TABLET: 5 | 4 days supply | Qty: 4 | Fill #0

## 2017-05-05 MED FILL — ACETAMINOPHEN 325 MG TABLET: 325 | 12 days supply | Qty: 100 | Fill #0

## 2017-05-05 NOTE — ED Provider Notes (Addendum)
[facility] EMERGENCY DEPARTMENT Provider Note   CSN: [id number] Arrival date & time: 05/05/17  4193     History   Chief Complaint Chief Complaint  Patient presents with  . Abdominal Pain    HPI Kavontae Pritchard is a 37 y.o. male.  HPI   Patient is a 37 year old male who presents the ED today complaining of right upper quadrant abdominal pain that has been intermittent for the last week and became worse today.  Describes the pain as severe in nature.  States that it is intermittent and is provoked by coughing or moving.  Denies pain with inspiration.  Denies chest pain or shortness of breath.  Denies fevers, nausea, vomiting, diarrhea, constipation.  Denies any GU symptoms.  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  He tried taking a muscle relaxer prior to arrival and states that helped his symptoms somewhat but did not completely resolve sxs.  He has no symptoms at rest but has sharp pain with movement and cough.  Denies pain to his ribs.  No recent falls or trauma.  History reviewed. No pertinent past medical history.  There are no active problems to display for this patient.   History reviewed. No pertinent surgical history.      Home Medications    Prior to Admission medications   Medication Sig Start Date End Date Taking? Authorizing Provider  acetaminophen (TYLENOL) 325 MG tablet Take 2 tablets (650 mg total) by mouth every 6 (six) hours as needed. Do not take more than 4058m of tylenol per day 05/05/17   Ronson Hagins S, PA-C  cyclobenzaprine (FLEXERIL) 5 MG tablet Take 1 tablet (5 mg total) by mouth at bedtime as needed for up to 5 days for muscle spasms. You may take 1/2 tablet up to three times daily as needed. Do not drive, operate machinery, or work while taking this medication. 05/05/17 05/10/17  Kenniel Bergsma S, PA-C  ibuprofen (ADVIL,MOTRIN) 200 MG tablet Take 2 tablets (400 mg total) by mouth every 6 (six) hours as  needed. 05/05/17   Karrine Kluttz S, PA-C    Family History No family history on file.  Social History Social History   Tobacco Use  . Smoking status: Current Every Day Smoker    Packs/day: 1.00  . Smokeless tobacco: Never Used  Substance Use Topics  . Alcohol use: Yes    Comment: occasional  . Drug use: Yes    Frequency: 7.0 times per week    Types: Marijuana     Allergies   Shellfish allergy   Review of Systems Review of Systems  Constitutional: Negative for chills and fever.  HENT: Negative for ear pain and sore throat.   Eyes: Negative for pain and visual disturbance.  Respiratory: Negative for shortness of breath.   Cardiovascular: Negative for chest pain and palpitations.  Gastrointestinal: Positive for abdominal pain. Negative for blood in stool, constipation, diarrhea, nausea and vomiting.  Genitourinary: Negative for dysuria, flank pain, frequency, hematuria, penile swelling and testicular pain.  Musculoskeletal: Negative for back pain.  Skin: Negative for rash.  Neurological: Negative for dizziness and light-headedness.  All other systems reviewed and are negative.    Physical Exam Updated Vital Signs BP 121/75 (BP Location: Right Arm)   Pulse 63   Temp 98.5 F (36.9 C) (Oral)   Resp 18   SpO2 99%   Physical Exam  Constitutional: He is oriented to person, place, and time. He appears well-developed and  well-nourished.  Patient is sleeping on exam, but is alert.  He is in no acute distress.  Nontoxic.  HENT:  Head: Normocephalic and atraumatic.  Mouth/Throat: Oropharynx is clear and moist.  Eyes: Pupils are equal, round, and reactive to light. Conjunctivae and EOM are normal.  Neck: Neck supple.  Cardiovascular: Normal rate, regular rhythm and normal heart sounds.  No murmur heard. Pulmonary/Chest: Effort normal and breath sounds normal. No respiratory distress. He has no wheezes.  Abdominal: Soft. Normal appearance and bowel sounds are normal.  There is guarding. There is no rigidity, no rebound and no CVA tenderness.  RUQ ttp. Negative murphy's sign.  Musculoskeletal: He exhibits no edema.  Neurological: He is alert and oriented to person, place, and time.  Skin: Skin is warm and dry.  Psychiatric: He has a normal mood and affect.  Nursing note and vitals reviewed.    ED Treatments / Results  Labs (all labs ordered are listed, but only abnormal results are displayed) Labs Reviewed  CBC WITH DIFFERENTIAL/PLATELET - Abnormal; Notable for the following components:      Result Value   RBC 3.44 (*)    Hemoglobin 11.9 (*)    HCT 35.2 (*)    MCV 102.3 (*)    MCH 34.6 (*)    All other components within normal limits  COMPREHENSIVE METABOLIC PANEL - Abnormal; Notable for the following components:   Glucose, Bld 111 (*)    Calcium 8.8 (*)    ALT 13 (*)    All other components within normal limits  URINALYSIS, ROUTINE W REFLEX MICROSCOPIC - Abnormal; Notable for the following components:   Specific Gravity, Urine <1.005 (*)    Hgb urine dipstick SMALL (*)    All other components within normal limits  URINALYSIS, MICROSCOPIC (REFLEX) - Abnormal; Notable for the following components:   Bacteria, UA RARE (*)    All other components within normal limits  LIPASE, BLOOD    EKG None  Radiology US Abdomen Limited Ruq  Result Date: 05/05/2017 CLINICAL DATA:  Right upper quadrant tenderness, worse today EXAM: ULTRASOUND ABDOMEN LIMITED RIGHT UPPER QUADRANT COMPARISON:  None. FINDINGS: Gallbladder: The gallbladder is visualized and no gallstones are seen. However, there may be a small gallbladder polyp of only 1.5 mm present. There is no pain over the gallbladder and gallbladder wall is not thickened. Common bile duct: Diameter: The common bile duct is normal measuring 2.9 mm in diameter. Liver: The parenchyma of the liver is somewhat echogenic suggesting hepatic steatosis. No focal hepatic abnormality is seen. IMPRESSION: 1. No  gallstones.  Tiny single gallbladder polyp of 1.5 cm. 2. Echogenic liver parenchyma consistent with hepatic steatosis. No focal hepatic abnormality. Electronically Signed   By: Ivar Drape M.D.   On: 05/05/2017 11:17    Procedures Procedures (including critical care time)  Medications Ordered in ED Medications - No data to display   Initial Impression / Assessment and Plan / ED Course  I have reviewed the triage vital signs and the nursing notes.  Pertinent labs & imaging results that were available during my care of the patient were reviewed by me and considered in my medical decision making (see chart for details).   Final Clinical Impressions(s) / ED Diagnoses   Final diagnoses:  RUQ abdominal tenderness  Muscle strain  Gallbladder polyp   Patient presented with right upper quadrant abdominal pain that has been intermittent for a week that is provoked by coughing and moving.  Patient does a lot of  heavy lifting at work.  He is nontoxic.  His vital signs are normal.  Suspect that he pulled a muscle while moving furniture at his job however will obtain basic labs, UA, and right upper quadrant ultrasound to rule out acute gallbladder pathology.  Labs with mild macrocytic anemia and no leukocytosis. CMP WNL with Normal alk phos and bilirubin. lipase WNL. UA with hematuria but no evidence of UTI. RUQ [place] negative for acute cholecystitis.  Pts pain has improved since he took muscle relaxer PTA. Repeat abd exam benign. Suspect that sxs due to abd wall muscle strain. Do not suspect emergent or life threatening intraabdominal etiology of pain. Will give Rx for muscle relaxer and advised OTC pain medications for further pain control. Advised warm/cold compresses and pcp f/u in 1 week for recheck. Return precautions discussed and pt and his friend at bedside understand the plan and reasons to return immediately to the ER. All questions answered.   ED Discharge Orders        Ordered     cyclobenzaprine (FLEXERIL) 5 MG tablet  At bedtime PRN     05/05/17 1124    acetaminophen (TYLENOL) 325 MG tablet  Every 6 hours PRN     05/05/17 1124    ibuprofen (ADVIL,MOTRIN) 200 MG tablet  Every 6 hours PRN     05/05/17 1124       Montray Kliebert S, PA-C 05/05/17 1143    Shalisha Clausing S, PA-C 05/05/17 1144    Davonna Belling, MD 05/05/17 1537

## 2017-05-05 NOTE — ED Triage Notes (Signed)
Pt c/o RUQ pain x1wk and became more intense today

## 2017-05-05 NOTE — Discharge Instructions (Addendum)
You may alternate taking Tylenol and Ibuprofen as needed for pain control. You may take 200-400 mg of ibuprofen every 6 hours and [phone] mg of Tylenol every 6 hours. Do not exceed 4000 mg of Tylenol daily as this can lead to liver damage. Also, make sure to take Ibuprofen with meals as it can cause an upset stomach. Do not take other NSAIDs while taking Ibuprofen such as (Aleve, Naprosyn, Aspirin, Celebrex, etc) and do not take more than the prescribed dose as this can lead to ulcers and bleeding in your GI tract. You may use warm and cold compresses to help with your symptoms.   You were given a prescription for Flexeril which is a muscle relaxer that you may take at bedtime.  You should not drive, work, or operate machinery while taking this medication as it can make you very drowsy.    Please follow up with your primary doctor within the next 7-10 days for re-evaluation and further treatment of your symptoms.   Please follow up with your primary doctor within the next 5-7 days.  If you do not have a primary care provider, information for a healthcare clinic has been provided for you to make arrangements for follow up care. Please return to the ER sooner if you have any new or worsening symptoms, or if you have any of the following symptoms:  Abdominal pain that does not go away.  You have a fever.  You keep throwing up (vomiting).  The pain is felt only in portions of the abdomen. Pain in the right side could possibly be appendicitis. In an adult, pain in the left lower portion of the abdomen could be colitis or diverticulitis.  You pass bloody or black tarry stools.  There is bright red blood in the stool.  The constipation stays for more than 4 days.  There is belly (abdominal) or rectal pain.  You do not seem to be getting better.  You have any questions or concerns.

## 2019-02-02 ENCOUNTER — Other Ambulatory Visit: Payer: Self-pay

## 2019-02-02 ENCOUNTER — Emergency Department (HOSPITAL_BASED_OUTPATIENT_CLINIC_OR_DEPARTMENT_OTHER)
Admission: EM | Admit: 2019-02-02 | Discharge: 2019-02-03 | Disposition: A | Discharge status: Home / Self Care | Payer: Self-pay | Attending: Emergency Medicine | Admitting: Emergency Medicine

## 2019-02-02 ENCOUNTER — Encounter (HOSPITAL_BASED_OUTPATIENT_CLINIC_OR_DEPARTMENT_OTHER): Payer: Self-pay | Admitting: *Deleted

## 2019-02-02 DIAGNOSIS — Z113 Encounter for screening for infections with a predominantly sexual mode of transmission: Secondary | ICD-10-CM | POA: Insufficient documentation

## 2019-02-02 DIAGNOSIS — R369 Urethral discharge, unspecified: Secondary | ICD-10-CM | POA: Insufficient documentation

## 2019-02-02 DIAGNOSIS — R300 Dysuria: Secondary | ICD-10-CM | POA: Insufficient documentation

## 2019-02-02 DIAGNOSIS — F17200 Nicotine dependence, unspecified, uncomplicated: Secondary | ICD-10-CM | POA: Insufficient documentation

## 2019-02-02 NOTE — ED Triage Notes (Signed)
Pt c/o penis discharge x 1 day 

## 2019-02-03 LAB — URINALYSIS, ROUTINE W REFLEX MICROSCOPIC
Bilirubin Urine: NEGATIVE
Glucose, UA: NEGATIVE mg/dL
Ketones, ur: NEGATIVE mg/dL
Nitrite: NEGATIVE
Protein, ur: NEGATIVE mg/dL
Specific Gravity, Urine: 1.02 (ref 1.005–1.030)
pH: 6.5 (ref 5.0–8.0)

## 2019-02-03 LAB — URINALYSIS, MICROSCOPIC (REFLEX): WBC, UA: >50 WBC/hpf (ref 0–5)

## 2019-02-03 MED ORDER — CEFTRIAXONE SODIUM 500 MG IJ SOLR
500.0000 mg | Freq: Once | INTRAMUSCULAR | Status: AC
  Administered 2019-02-03: 500 mg via INTRAMUSCULAR
  Filled 2019-02-03: qty 500

## 2019-02-03 MED ORDER — AZITHROMYCIN 250 MG PO TABS
1000.0000 mg | ORAL_TABLET | Freq: Once | ORAL | Status: AC
  Administered 2019-02-03: 1000 mg via ORAL
  Filled 2019-02-03: qty 4

## 2019-02-03 NOTE — Discharge Instructions (Addendum)
No unprotected sex for 2 weeks.  If your test comes back positive, you will need to notify your partners so that they too can be treated.

## 2019-02-03 NOTE — ED Provider Notes (Signed)
[place] EMERGENCY DEPARTMENT Provider Note   CSN: [id number] Arrival date & time: 02/02/19  2300     History Chief Complaint  Patient presents with  . Penile Discharge    Seth Martin is a 39 y.o. male.  Patient is a 39 year old male presenting with complaints of penile discharge and dysuria.  This is been ongoing for the past 2 days.  Patient states that he had unprotected sex with an ax girlfriend prior to the onset of symptoms.  He denies fevers or chills.  The history is provided by the patient.  Penile Discharge This is a new problem. The current episode started 2 days ago. The problem occurs constantly. The problem has been gradually worsening. Exacerbated by: Urinating. Nothing relieves the symptoms.       History reviewed. No pertinent past medical history.  There are no problems to display for this patient.   History reviewed. No pertinent surgical history.     History reviewed. No pertinent family history.  Social History   Tobacco Use  . Smoking status: Current Every Day Smoker    Packs/day: 1.00  . Smokeless tobacco: Never Used  Substance Use Topics  . Alcohol use: Yes    Comment: occasional  . Drug use: Yes    Frequency: 7.0 times per week    Types: Marijuana    Home Medications Prior to Admission medications   Medication Sig Start Date End Date Taking? Authorizing Provider  acetaminophen (TYLENOL) 325 MG tablet Take 2 tablets (650 mg total) by mouth every 6 (six) hours as needed. Do not take more than 4000mg  of tylenol per day 05/05/17   Couture, Cortni S, PA-C  ibuprofen (ADVIL,MOTRIN) 200 MG tablet Take 2 tablets (400 mg total) by mouth every 6 (six) hours as needed. 05/05/17   Couture, Cortni S, PA-C    Allergies    Shellfish allergy  Review of Systems   Review of Systems  Genitourinary: Positive for discharge.  All other systems reviewed and are negative.   Physical Exam Updated Vital Signs BP 132/74   Pulse 82    Temp 98.9 F (37.2 C)   Resp 18   Ht 6' (1.829 m)   Wt 70.3 kg   SpO2 100%   BMI 21.02 kg/m   Physical Exam Vitals and nursing note reviewed.  Constitutional:      General: He is not in acute distress.    Appearance: Normal appearance. He is not ill-appearing.  HENT:     Head: Normocephalic and atraumatic.  Cardiovascular:     Pulses: Normal pulses.  Skin:    General: Skin is warm and dry.  Neurological:     Mental Status: He is alert.     ED Results / Procedures / Treatments   Labs (all labs ordered are listed, but only abnormal results are displayed) Labs Reviewed  URINALYSIS, ROUTINE W REFLEX MICROSCOPIC - Abnormal; Notable for the following components:      Result Value   APPearance CLOUDY (*)    Hgb urine dipstick SMALL (*)    Leukocytes,Ua LARGE (*)    All other components within normal limits  URINALYSIS, MICROSCOPIC (REFLEX) - Abnormal; Notable for the following components:   Bacteria, UA MANY (*)    All other components within normal limits  GC/CHLAMYDIA PROBE AMP ([place]) NOT AT [facility]  GC/CHLAMYDIA PROBE AMP ([place]) NOT AT [facility]    EKG None  Radiology No results found.  Procedures Procedures (including critical care time)  Medications Ordered in ED Medications  cefTRIAXone (ROCEPHIN) injection 500 mg (has no administration in time range)  azithromycin (ZITHROMAX) tablet 1,000 mg (has no administration in time range)    ED Course  I have reviewed the triage vital signs and the nursing notes.  Pertinent labs & imaging results that were available during my care of the patient were reviewed by me and considered in my medical decision making (see chart for details).    MDM Rules/Calculators/A&P  Patient presenting with penile discharge and dysuria after unprotected sex with an ex girlfriend.  Patient likely has gonorrhea or chlamydia, or possibly both.  He will be treated presumptively.  Final Clinical Impression(s) / ED  Diagnoses Final diagnoses:  None    Rx / DC Orders ED Discharge Orders    None       Geoffery Lyons, MD 02/03/19 [phone]

## 2019-02-04 LAB — GC/CHLAMYDIA PROBE AMP (~~LOC~~) NOT AT ARMC
Chlamydia: POSITIVE — AB
Neisseria Gonorrhea: POSITIVE — AB

## 2019-03-31 IMAGING — US US ABDOMEN LIMITED
1 series · 14 of 25 positions shown · non-contrast
Comparison: None.

CLINICAL DATA: Right upper quadrant tenderness, worse today

EXAM:
ULTRASOUND ABDOMEN LIMITED RIGHT UPPER QUADRANT

[Series 1: us abdomen limited · 0.22mm/px · 14 of 72 slices shown]
[im 1/72]
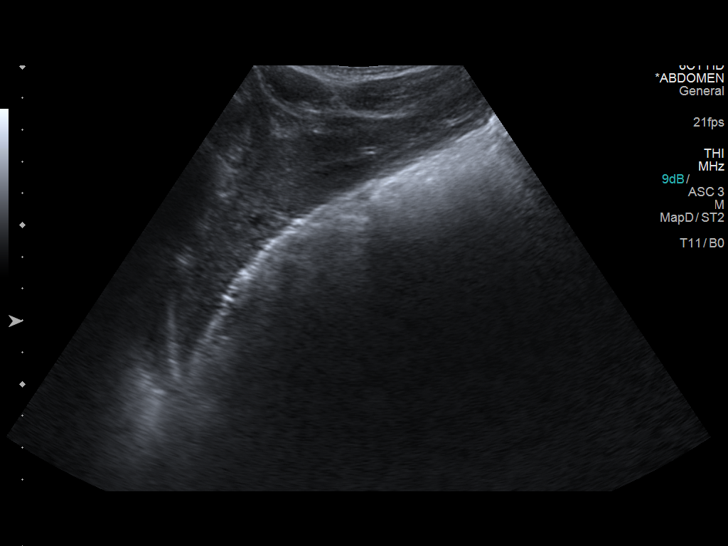
[im 6/72]
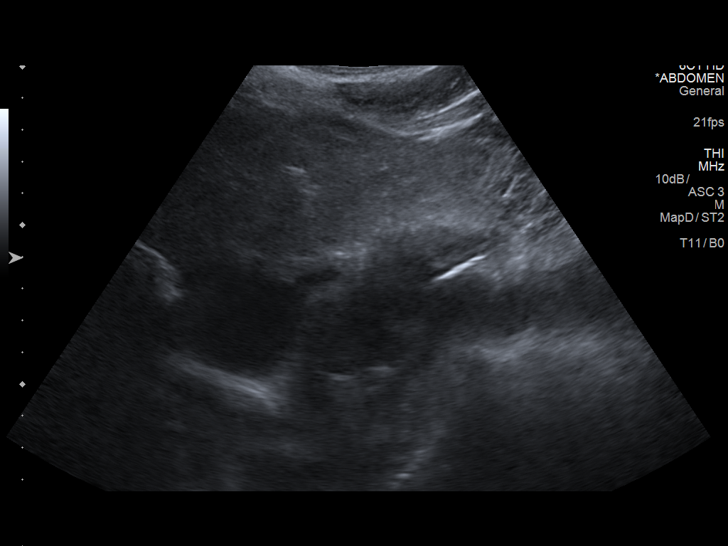
[im 12/72]
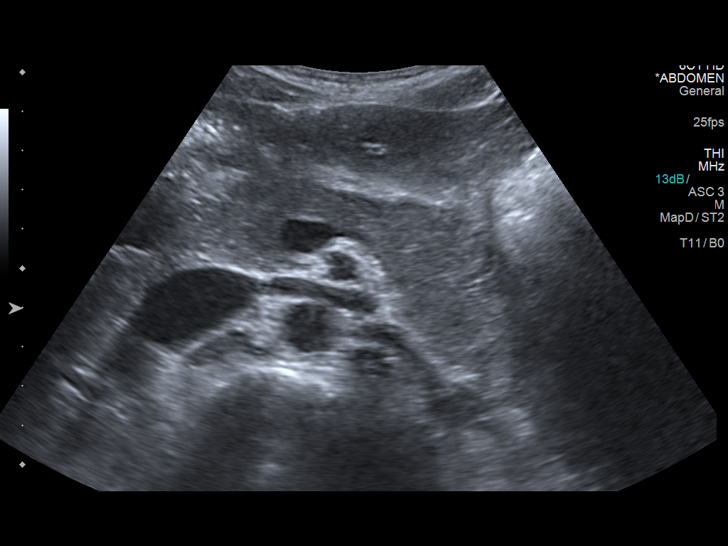
[im 18/72]
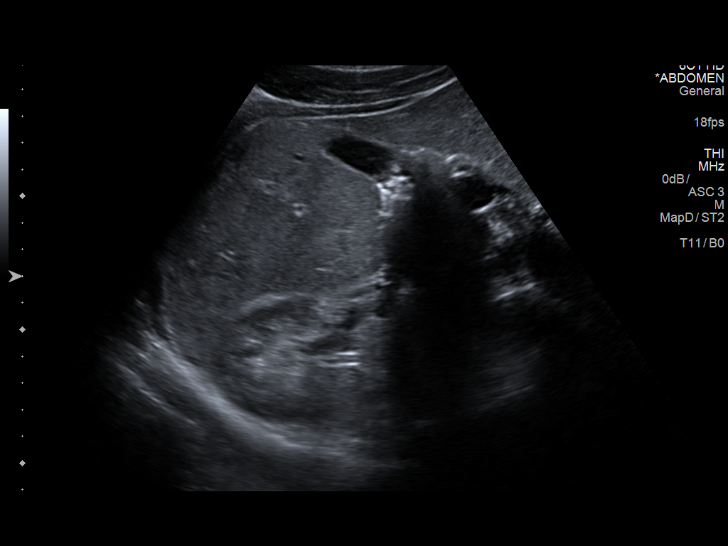
[im 24/72]
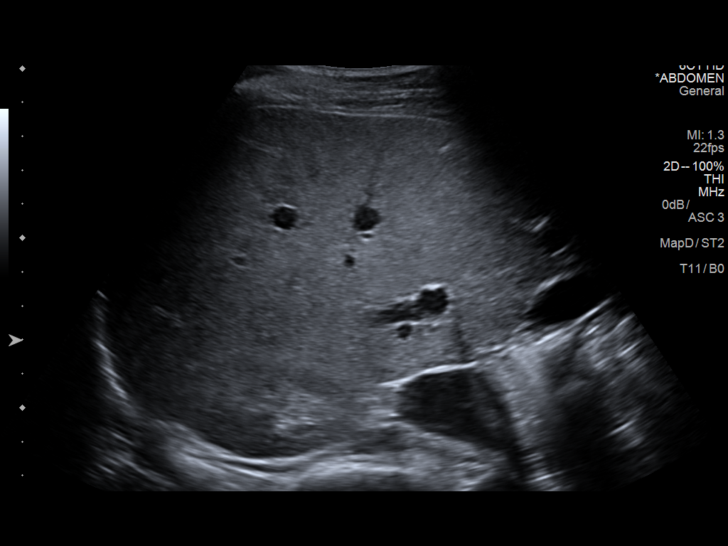
[im 27/72]
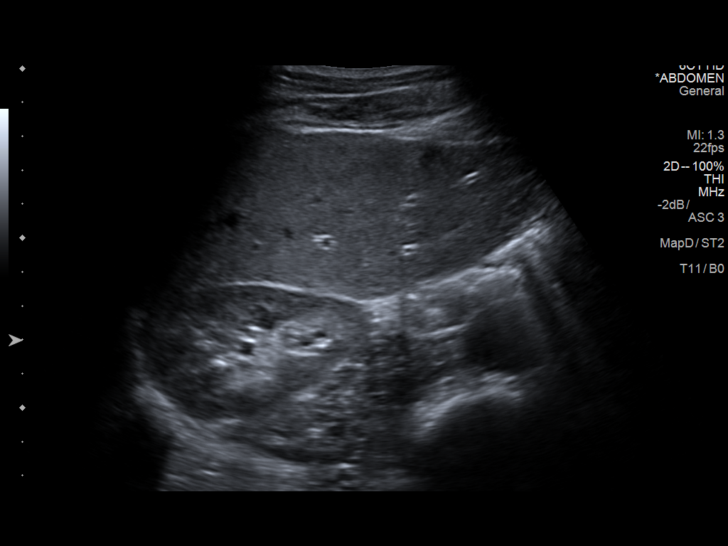
[im 33/72]
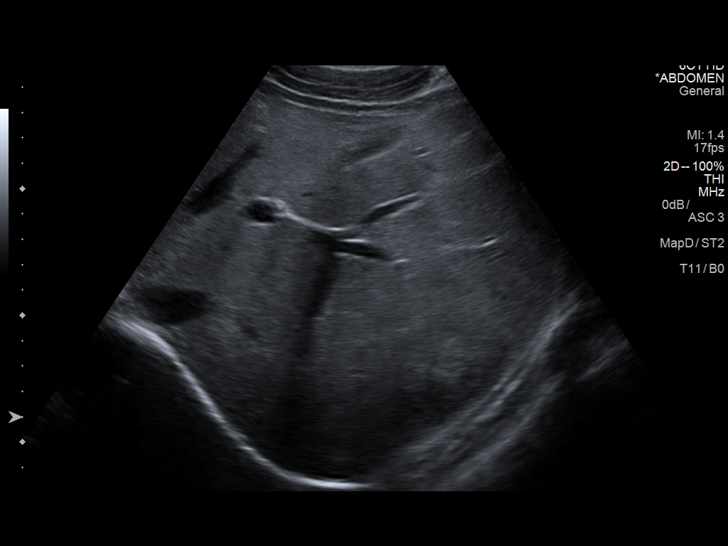
[im 39/72]
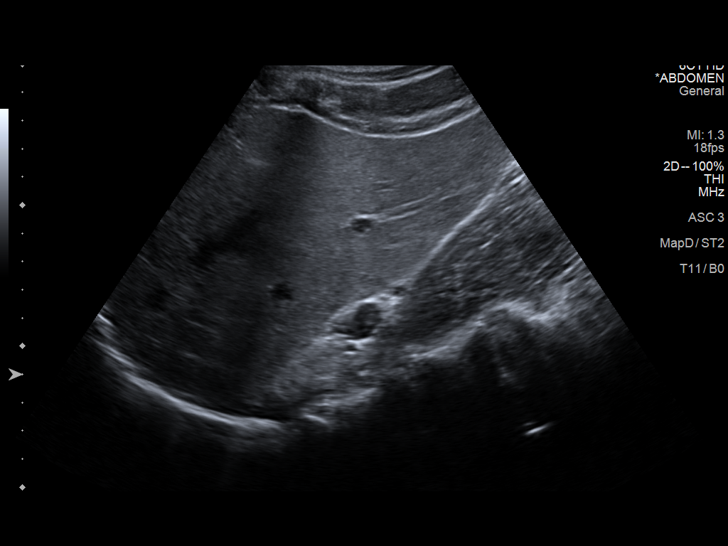
[im 45/72]
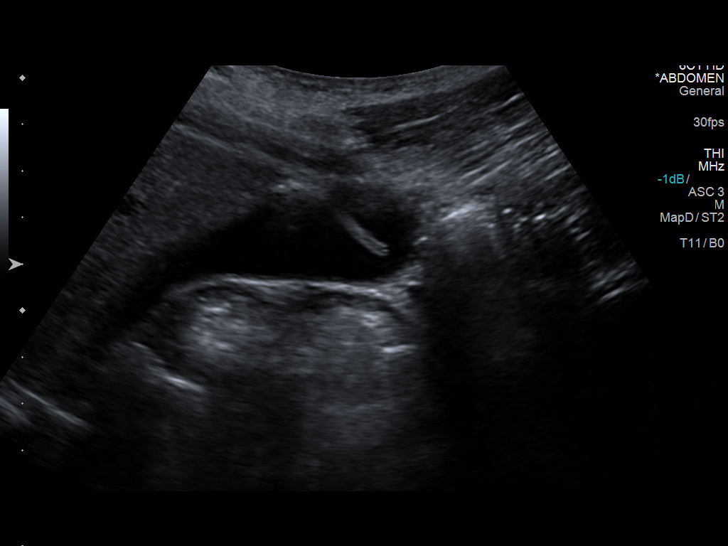
[im 48/72]
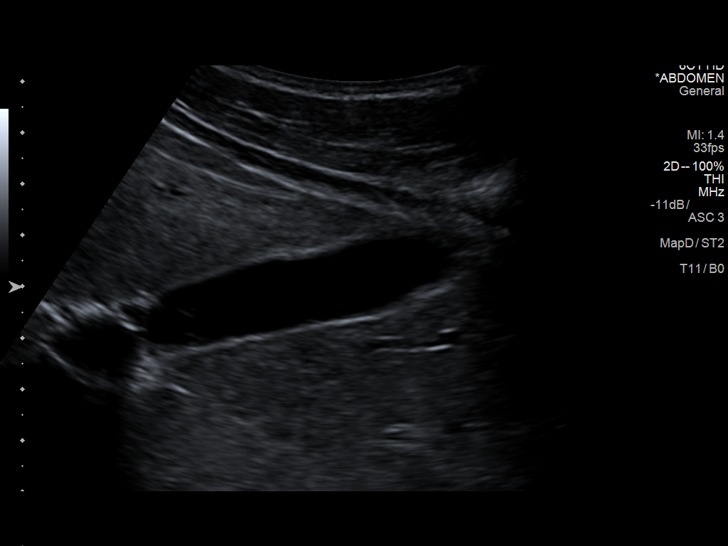
[im 54/72]
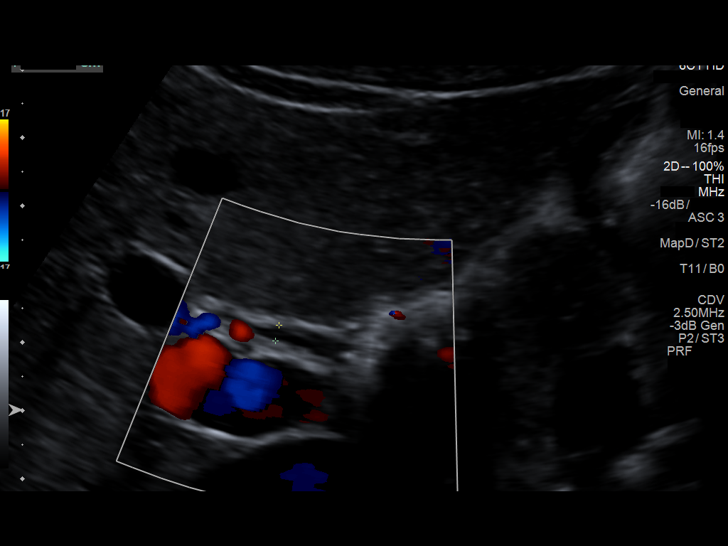
[im 60/72]
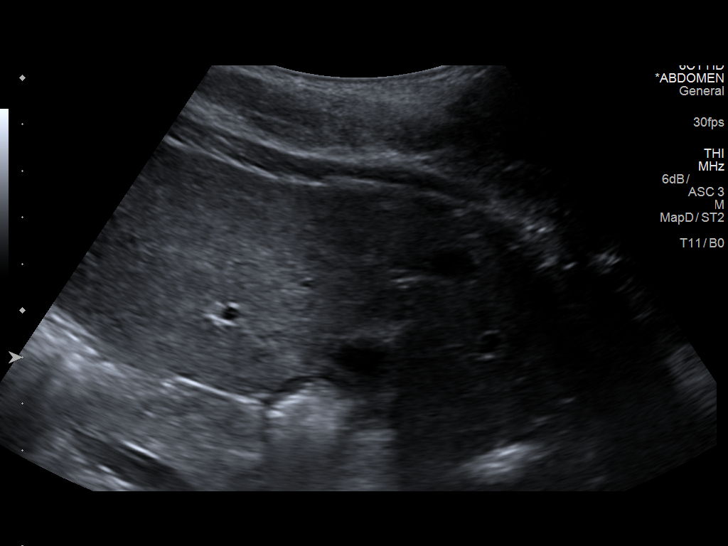
[im 66/72]
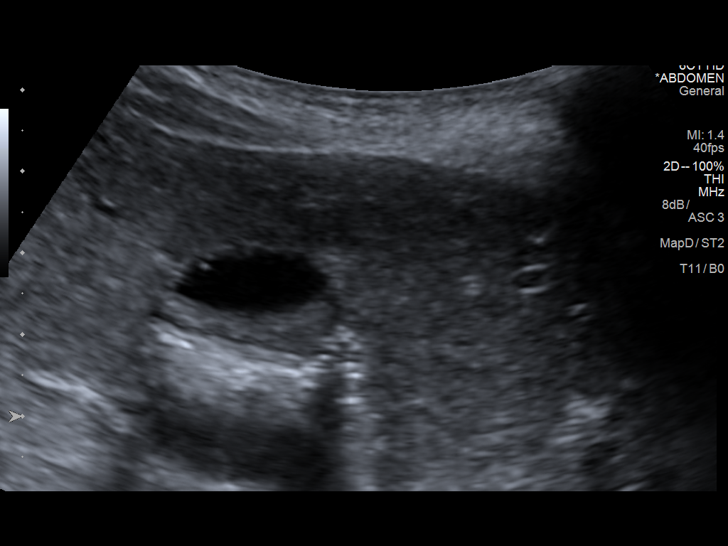
[im 72/72]
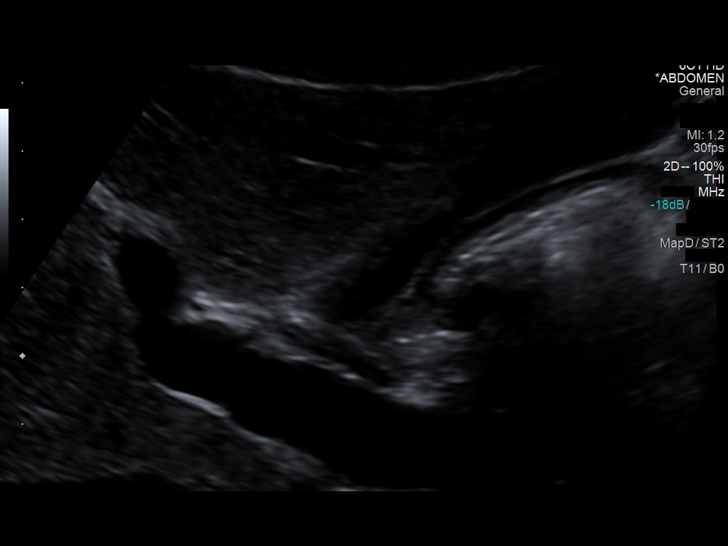

[14 of 25 positions shown; findings below may reference images not displayed]

FINDINGS: Gallbladder:

The gallbladder is visualized and no gallstones are seen. However,
there may be a small gallbladder polyp of only 1.5 mm present. There
is no pain over the gallbladder and gallbladder wall is not
thickened.

Common bile duct:

Diameter: The common bile duct is normal measuring 2.9 mm in
diameter.

Liver:

The parenchyma of the liver is somewhat echogenic suggesting hepatic
steatosis. No focal hepatic abnormality is seen.
IMPRESSION: 1. No gallstones.  Tiny single gallbladder polyp of 1.5 cm.
2. Echogenic liver parenchyma consistent with hepatic steatosis. No
focal hepatic abnormality.

## 2023-03-20 ENCOUNTER — Other Ambulatory Visit (HOSPITAL_BASED_OUTPATIENT_CLINIC_OR_DEPARTMENT_OTHER): Payer: Self-pay

## 2023-03-20 ENCOUNTER — Encounter (HOSPITAL_BASED_OUTPATIENT_CLINIC_OR_DEPARTMENT_OTHER): Payer: Self-pay | Admitting: Emergency Medicine

## 2023-03-20 ENCOUNTER — Other Ambulatory Visit: Payer: Self-pay

## 2023-03-20 ENCOUNTER — Emergency Department (HOSPITAL_BASED_OUTPATIENT_CLINIC_OR_DEPARTMENT_OTHER)
Admission: EM | Admit: 2023-03-20 | Discharge: 2023-03-20 | Disposition: A | Discharge status: Home / Self Care | Payer: Self-pay | Attending: Emergency Medicine | Admitting: Emergency Medicine

## 2023-03-20 DIAGNOSIS — K649 Unspecified hemorrhoids: Secondary | ICD-10-CM

## 2023-03-20 DIAGNOSIS — L03317 Cellulitis of buttock: Secondary | ICD-10-CM | POA: Diagnosis not present

## 2023-03-20 DIAGNOSIS — K644 Residual hemorrhoidal skin tags: Secondary | ICD-10-CM | POA: Diagnosis not present

## 2023-03-20 DIAGNOSIS — K6289 Other specified diseases of anus and rectum: Secondary | ICD-10-CM | POA: Diagnosis present

## 2023-03-20 MED ORDER — OXYCODONE-ACETAMINOPHEN 5-325 MG PO TABS
1.0000 | ORAL_TABLET | Freq: Four times a day (QID) | ORAL | 0 refills | Status: AC | PRN
  Filled 2023-03-20: qty 15, 4d supply, fill #0

## 2023-03-20 MED ORDER — HYDROCORTISONE (PERIANAL) 2.5 % EX CREA
1.0000 | TOPICAL_CREAM | Freq: Two times a day (BID) | CUTANEOUS | 0 refills | Status: AC
  Filled 2023-03-20: qty 30, 10d supply, fill #0

## 2023-03-20 MED ORDER — DOXYCYCLINE HYCLATE 100 MG PO CAPS
100.0000 mg | ORAL_CAPSULE | Freq: Two times a day (BID) | ORAL | 0 refills | Status: AC
  Filled 2023-03-20: qty 14, 7d supply, fill #0

## 2023-03-20 NOTE — ED Triage Notes (Signed)
 Abscess to gluteal fold x 2 days.  No drainage.  No known fever.

## 2023-03-20 NOTE — Discharge Instructions (Addendum)
 Your history, exam, and evaluation today seem consistent with hemorrhoidal pain causing the discomfort and swelling near your bottom as opposed to an abscess or boil as we discussed.  As you have had areas of infection on your bottom in the past, we did agree to give you some antibiotics just in case there is some small component of cellulitis more posteriorly.  Otherwise we had a shared decision-making conversation and agreed together to hold on more extensive workup with imaging or labs today and also agreed to hold on drainage attempt at this time as I clinically suspect this is more of a thrombosed hemorrhoid and it has been 3 days since the symptoms began.  That being said, please take the Anusol cream to help with it and the pain medicine as well.  Make sure you are staying hydrated to prevent constipation.  You may also use stool softeners if you would like.  Please take the antibiotics just in case there is a component of cellulitis.  If any symptoms change or worsen acutely, please return to the nearest emergency department I would recommend going to [facility] where there are more resources if possible.  Please follow-up with general surgery if this continues to be a problem with hemorrhoids.

## 2023-03-20 NOTE — ED Provider Notes (Signed)
 [place] EMERGENCY DEPARTMENT AT [facility] Provider Note   CSN: [id number] Arrival date & time: 03/20/23  1105     History  Chief Complaint  Patient presents with   Abscess    Seth Martin is a 43 y.o. male.  The history is provided by the patient and medical records. No language interpreter was used.  Illness Location:  Near rectum Quality:  3 days of pain and swelling Severity:  Moderate Onset quality:  Gradual Duration:  3 days Timing:  Constant Progression:  Unchanged Chronicity:  Recurrent Associated symptoms: no abdominal pain, no chest pain, no congestion, no cough, no diarrhea, no fatigue, no fever, no headaches, no loss of consciousness, no nausea, no rash, no rhinorrhea, no shortness of breath and no vomiting        Home Medications Prior to Admission medications   Medication Sig Start Date End Date Taking? Authorizing Provider  acetaminophen (TYLENOL) 325 MG tablet Take 2 tablets (650 mg total) by mouth every 6 (six) hours as needed. Do not take more than 4000mg  of tylenol per day 05/05/17   Couture, Cortni S, PA-C  ibuprofen (ADVIL,MOTRIN) 200 MG tablet Take 2 tablets (400 mg total) by mouth every 6 (six) hours as needed. 05/05/17   Couture, Cortni S, PA-C      Allergies    Shellfish allergy and Penicillins    Review of Systems   Review of Systems  Constitutional:  Negative for chills, fatigue and fever.  HENT:  Negative for congestion and rhinorrhea.   Respiratory:  Negative for cough, chest tightness and shortness of breath.   Cardiovascular:  Negative for chest pain.  Gastrointestinal:  Positive for rectal pain (near rectum). Negative for abdominal pain, constipation, diarrhea, nausea and vomiting.  Genitourinary:  Negative for decreased urine volume, dysuria, flank pain, penile pain, penile swelling, scrotal swelling and testicular pain.  Musculoskeletal:  Negative for back pain, neck pain and neck stiffness.  Skin:  Negative for rash  and wound.  Neurological:  Negative for loss of consciousness and headaches.  Psychiatric/Behavioral:  Negative for agitation.   All other systems reviewed and are negative.   Physical Exam Updated Vital Signs BP (!) 126/94 (BP Location: Right Arm)   Pulse 99   Temp 98.3 F (36.8 C) (Oral)   Resp 15   Ht 6\' 1"  (1.854 m)   Wt 65.8 kg   SpO2 98%   BMI 19.13 kg/m  Physical Exam Vitals and nursing note reviewed. Exam conducted with a chaperone present.  Constitutional:      General: He is not in acute distress.    Appearance: He is well-developed. He is not ill-appearing, toxic-appearing or diaphoretic.  HENT:     Head: Normocephalic and atraumatic.     Nose: No congestion or rhinorrhea.     Mouth/Throat:     Pharynx: No oropharyngeal exudate or posterior oropharyngeal erythema.  Eyes:     Extraocular Movements: Extraocular movements intact.     Conjunctiva/sclera: Conjunctivae normal.     Pupils: Pupils are equal, round, and reactive to light.  Cardiovascular:     Rate and Rhythm: Normal rate and regular rhythm.     Heart sounds: No murmur heard. Pulmonary:     Effort: Pulmonary effort is normal. No respiratory distress.     Breath sounds: Normal breath sounds. No rhonchi or rales.  Chest:     Chest wall: No tenderness.  Abdominal:     General: Abdomen is flat.  Palpations: Abdomen is soft.     Tenderness: There is no abdominal tenderness. There is no right CVA tenderness, left CVA tenderness, guarding or rebound.  Genitourinary:    Rectum: External hemorrhoid present.       Comments: 3 cm bulge that is tubular coming from the rectum going posteriorly on the left side.  No tenderness in the perineum or towards the scrotum.  No tenderness of the actual rectum itself.  No surrounding warmth or erythema there but there was 1 little red erythematous spot posteriorly that was not draining.  Slight tenderness at that spot.  Ultrasound was utilized and did not see evidence  of spreading cellulitis nor was there a large evidence of an abscess but did appear to show a hemorrhoid with walls. Musculoskeletal:        General: Swelling and tenderness present.     Cervical back: Neck supple.     Right lower leg: No edema.     Left lower leg: No edema.  Skin:    General: Skin is warm and dry.     Capillary Refill: Capillary refill takes less than 2 seconds.     Findings: No erythema or rash.  Neurological:     General: No focal deficit present.     Mental Status: He is alert.     Sensory: No sensory deficit.     Motor: No weakness.  Psychiatric:        Mood and Affect: Mood normal.     ED Results / Procedures / Treatments   Labs (all labs ordered are listed, but only abnormal results are displayed) Labs Reviewed - No data to display  EKG None  Radiology No results found.  Procedures Procedures    Medications Ordered in ED Medications - No data to display  ED Course/ Medical Decision Making/ A&P                                 Medical Decision Making Risk Prescription drug management.    [name] is a 43 y.o. male with no significant past medical history who presents with pain near his rectum.  According to patient, for the last 3 days he has had pain near his bottom and has had difficulty sitting down.  He reports has had a small times in the past and usually this goes away on its own after a day or 2.  He said he is never had any abscesses or boils drained before.  He denies any skin injuries or scrapes and has had otherwise no symptoms of systemic infection with no fevers, chills, congestion, cough, nausea, vomiting, constipation, diarrhea, or urinary changes.  He sometimes goes a day or 2 between bowel movements.  He otherwise denies any urinary changes.  Also denies history of hemorrhoids to his knowledge.  On exam, lungs clear.  Chest nontender.  Abdomen nontender.  Back nontender.  With a chaperone, he was examined and he has what  appears to be a large hemorrhoid of several centimeters long coming near his rectum.  There was no tenderness aside from the tubular looking area of swelling and there was no erythema present near the perineum or near the scrotum.  No other tenderness on that area.  He did not have tenderness when the rectum was palpated self and there was no drainage present.  There was 1 little spot of red more posteriorly that did not convincingly look  like cellulitis or a boil.  An ultrasound was utilized and it did not appear to fit the clinical picture of a abscess but did appear more like a vein that was thrombosed with walls.  We had a shared decision-making conversation on management including doing labs, imaging, or treatment with different medications.  As it has been 3 days since symptoms began do not feel he is a good candidate for thrombosed hemorrhoid drainage here in the emergency department.  Given how much looks like hemorrhoid we also discussed we do not want to treat as if it is just a boil or abscess to be drained at this time.  We together agreed to give Anusol and some pain medicine for the hemorrhoid and have him follow-up with general surgery and a PCP.  We also will give him some doxycycline to help if there is a small area of cellulitis more posteriorly although I am still cannot convinced there is an abscess.  We discussed how symptoms could worsen this area and he would need to return for full workup and imaging if symptoms were to change or worsen.  Patient agrees.  Patient no other questions or concerns and agrees with plan.  Patient was discharged in good condition for outpatient follow-up.             Final Clinical Impression(s) / ED Diagnoses Final diagnoses:  Hemorrhoids, unspecified hemorrhoid type  Cellulitis of buttock    Rx / DC Orders ED Discharge Orders          Ordered    doxycycline (VIBRAMYCIN) 100 MG capsule  2 times daily        03/20/23 1522     oxyCODONE-acetaminophen (PERCOCET/ROXICET) 5-325 MG tablet  Every 6 hours PRN        03/20/23 1522    hydrocortisone (ANUSOL-HC) 2.5 % rectal cream  2 times daily        03/20/23 1522            Clinical Impression: 1. Hemorrhoids, unspecified hemorrhoid type   2. Cellulitis of buttock     Disposition: Discharge  Condition: Good  I have discussed the results, Dx and Tx plan with the pt(& family if present). He/she/they expressed understanding and agree(s) with the plan. Discharge instructions discussed at great length. Strict return precautions discussed and pt &/or family have verbalized understanding of the instructions. No further questions at time of discharge.    Discharge Medication List as of 03/20/2023  3:25 PM     START taking these medications   Details  doxycycline (VIBRAMYCIN) 100 MG capsule Take 1 capsule (100 mg total) by mouth 2 (two) times daily for 7 days., Starting Fri 03/20/2023, Until Fri 03/27/2023, Normal    hydrocortisone (ANUSOL-HC) 2.5 % rectal cream Place 1 Application rectally 2 (two) times daily., Starting Fri 03/20/2023, Normal    oxyCODONE-acetaminophen (PERCOCET/ROXICET) 5-325 MG tablet Take 1 tablet by mouth every 6 (six) hours as needed for severe pain (pain score 7-10)., Starting Fri 03/20/2023, Normal        Follow Up: Surgery, [facility] [address] [place] [address] [phone]   with general surgery      Giuliana Handyside, Canary Brim, MD 03/20/23 [phone]

## 2023-10-05 ENCOUNTER — Encounter (HOSPITAL_BASED_OUTPATIENT_CLINIC_OR_DEPARTMENT_OTHER): Payer: Self-pay

## 2023-10-05 ENCOUNTER — Emergency Department (HOSPITAL_BASED_OUTPATIENT_CLINIC_OR_DEPARTMENT_OTHER)
Admission: EM | Admit: 2023-10-05 | Discharge: 2023-10-05 | Disposition: A | Discharge status: Home / Self Care | Attending: Emergency Medicine | Admitting: Emergency Medicine

## 2023-10-05 ENCOUNTER — Other Ambulatory Visit: Payer: Self-pay

## 2023-10-05 DIAGNOSIS — Z202 Contact with and (suspected) exposure to infections with a predominantly sexual mode of transmission: Secondary | ICD-10-CM | POA: Insufficient documentation

## 2023-10-05 DIAGNOSIS — Z720 Tobacco use: Secondary | ICD-10-CM | POA: Insufficient documentation

## 2023-10-05 LAB — URINALYSIS, ROUTINE W REFLEX MICROSCOPIC
Bilirubin Urine: NEGATIVE
Glucose, UA: NEGATIVE mg/dL
Ketones, ur: NEGATIVE mg/dL
Leukocytes,Ua: NEGATIVE
Nitrite: NEGATIVE
Protein, ur: NEGATIVE mg/dL
Specific Gravity, Urine: 1.015 (ref 1.005–1.030)
pH: 8 (ref 5.0–8.0)

## 2023-10-05 LAB — URINALYSIS, MICROSCOPIC (REFLEX)

## 2023-10-05 LAB — RAPID HIV SCREEN (HIV 1/2 AB+AG)
HIV 1/2 Antibodies: NONREACTIVE
HIV-1 P24 Antigen - HIV24: NONREACTIVE

## 2023-10-05 MED ORDER — METRONIDAZOLE 500 MG PO TABS
2000.0000 mg | ORAL_TABLET | Freq: Once | ORAL | Status: AC
  Administered 2023-10-05: 2000 mg via ORAL
  Filled 2023-10-05: qty 4

## 2023-10-05 NOTE — ED Notes (Signed)
 Pt. Reports his partner told him yesterday she tested positive for Tric  Pt. Reports he has tested positive for other STDs in past but it has been a while.  Pt. Has allergy to PCN

## 2023-10-05 NOTE — ED Notes (Signed)
 Patient denies pain and is resting comfortably.

## 2023-10-05 NOTE — ED Provider Notes (Signed)
 [place] EMER[facility]ENC[facility] DEPART[facility] AT [facility] Provider Note   CSN: [id number] Arrival date & time: 10/05/23  1758     Patient presents with: Exposure to STD   Seth Martin is a 43 y.o. male.   43 year old male presenting due to STD exposure.  Patient was contacted by a sexual partner and told that they tested positive for trichomonas, no other known STD exposure.  Patient is asymptomatic, denies dysuria, penile discharge/lesions, rashes, fever.  He requests full STD testing.   Exposure to STD       Prior to Admission medications   Medication Sig Start Date End Date Taking? Authorizing Provider  acetaminophen  (TYLENOL ) 325 MG tablet Take 2 tablets (650 mg total) by mouth every 6 (six) hours as needed. Do not take more than 4000mg  of tylenol  per day 05/05/17   Couture, Cortni S, PA-C  hydrocortisone  (ANUSOL -HC) 2.5 % rectal cream Place 1 Application rectally 2 (two) times daily. 03/20/23   Tegeler, Lonni PARAS, MD  ibuprofen  (ADVIL ,MOTRIN ) 200 MG tablet Take 2 tablets (400 mg total) by mouth every 6 (six) hours as needed. 05/05/17   Couture, Cortni S, PA-C  oxyCODONE -acetaminophen  (PERCOCET/ROXICET) 5-325 MG tablet Take 1 tablet by mouth every 6 (six) hours as needed for severe pain (pain score 7-10). 03/20/23   Tegeler, Lonni PARAS, MD    Allergies: Shellfish allergy and Penicillins    Review of Systems  Updated Vital Signs  Vitals:   10/05/23 1810  BP: 123/83  Pulse: 91  Temp: 99.1 F (37.3 C)  TempSrc: Oral  SpO2: 96%     Physical Exam Vitals and nursing note reviewed.  HENT:     Head: Normocephalic.  Eyes:     Extraocular Movements: Extraocular movements intact.  Cardiovascular:     Rate and Rhythm: Normal rate.  Pulmonary:     Effort: Pulmonary effort is normal.  Musculoskeletal:     Cervical back: Normal range of motion.     Comments: Moves all extremities spontaneously without difficulty  Skin:    General: Skin is warm and dry.   Neurological:     Mental Status: He is alert and oriented to person, place, and time.     (all labs ordered are listed, but only abnormal results are displayed) Labs Reviewed  URINALYSIS, ROUTINE W REFLEX MICROSCOPIC - Abnormal; Notable for the following components:      Result Value   Hgb urine dipstick TRACE (*)    All other components within normal limits  URINALYSIS, MICROSCOPIC (REFLEX) - Abnormal; Notable for the following components:   Bacteria, UA RARE (*)    All other components within normal limits  WET PREP, GENITAL  RAPID HIV SCREEN (HIV 1/2 AB+AG)  RPR  GC/CHLAMYDIA PROBE AMP ( [place]) NOT AT [facility]    EKG: None  Radiology: No results found.   Procedures   Medications Ordered in the ED  metroNIDAZOLE  (FLAGYL ) tablet 2,000 mg (has no administration in time range)                                    Medical Decision Making This patient presents to the ED for concern of STD exposure, this involves an extensive number of treatment options, and is a complaint that carries with it a high risk of complications and morbidity.  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  The pertinent results include: Urinalysis with trace hemoglobin, rare bacteria.  Gonorrhea/chlamydia probe pending at time of discharge.  HIV/RPR pending at time of discharge.  Cardiac Monitoring: / EKG:  The patient was maintained on a cardiac monitor.  I personally viewed and interpreted the cardiac monitored which showed an underlying rhythm of: NSR   Problem List / ED Course / Critical interventions /  Medication management  I ordered medication including 2 g metronidazole  for trichomonas exposure I have reviewed the patients home medicines and have made adjustments as needed   Social Determinants of Health:  Tobacco use   Test / Admission - Considered:  Physical exam largely unremarkable.  Patient is asymptomatic, however will treat for trichomonas given that this is a known exposure and his current partner tested positive for trichomonas.  2 g metronidazole  given.  I attempted to order wet prep for detection of trichomonas in the patient's urine, however I was contacted by the lab and told that this could not be performed unless a swab was taken of his penile discharge, he does not have any discharge to swab.  Will presume that he is positive for trichomonas given the findings as above.  HIV negative, RPR and gonorrhea/chlamydia pending at time of shift change.  I discussed with the patient that he should avoid having sexual intercourse with his partner until they complete their course of antibiotics for treatment of trichomonas.  He voiced understanding.  Return precautions discussed, he is appropriate discharge at this time.     Amount and/or Complexity of Data Reviewed Labs: ordered.  Risk Prescription drug management.        Final diagnoses:  STD exposure    ED Discharge Orders     None          Glendia Rocky SAILOR, [place] 10/05/23 2125    Elnor Hila P, DO 10/18/23 1501

## 2023-10-05 NOTE — Discharge Instructions (Addendum)
 You were treated with metronidazole , an antibiotic, in the emergency department today for suspected trichomoniasis.  Please be cautious that you should not drink alcohol after taking this medication, as it may cause nausea/vomiting. Do not have sexual intercourse with your partner until after they have completed their course of antibiotics for trichomoniasis, as this may result in reinfection.  Please check MyChart for the remainder of your results that were not available at the time of your discharge.   I have provided you with the contact information for  community health and wellness, please schedule an appointment to establish care since you do not have a primary care provider.  Return to the emergency department if your symptoms worsen.

## 2023-10-05 NOTE — ED Triage Notes (Signed)
 Pt reports exposure to STD. States that his partner told him she tested positive for trich. Denies symptoms. Just wanted to be checked.

## 2023-10-06 LAB — RPR: RPR Ser Ql: NONREACTIVE

## 2023-10-06 LAB — GC/CHLAMYDIA PROBE AMP (~~LOC~~) NOT AT ARMC
Chlamydia: NEGATIVE
Comment: NEGATIVE
Comment: NORMAL
Neisseria Gonorrhea: NEGATIVE
